# Patient Record
Sex: Male | Born: 1935 | Race: White | Hispanic: No | Marital: Married | Smoking: Former smoker
Health system: Southern US, Community
[De-identification: ages and names within clinical notes are randomized; demographics above are authoritative.]

## PROBLEM LIST (undated history)

## (undated) DIAGNOSIS — N138 Other obstructive and reflux uropathy: Secondary | ICD-10-CM

## (undated) DIAGNOSIS — C61 Malignant neoplasm of prostate: Secondary | ICD-10-CM

## (undated) DIAGNOSIS — C73 Malignant neoplasm of thyroid gland: Secondary | ICD-10-CM

## (undated) DIAGNOSIS — I10 Essential (primary) hypertension: Secondary | ICD-10-CM

## (undated) DIAGNOSIS — E785 Hyperlipidemia, unspecified: Secondary | ICD-10-CM

## (undated) DIAGNOSIS — R413 Other amnesia: Secondary | ICD-10-CM

## (undated) DIAGNOSIS — C4400 Unspecified malignant neoplasm of skin of lip: Secondary | ICD-10-CM

## (undated) DIAGNOSIS — D649 Anemia, unspecified: Secondary | ICD-10-CM

## (undated) DIAGNOSIS — K469 Unspecified abdominal hernia without obstruction or gangrene: Secondary | ICD-10-CM

## (undated) HISTORY — PX: HERNIA REPAIR: SHX51

## (undated) HISTORY — DX: Anemia, unspecified: D64.9

## (undated) HISTORY — DX: Other amnesia: R41.3

## (undated) HISTORY — PX: JOINT REPLACEMENT: SHX530

## (undated) HISTORY — PX: EYE SURGERY: SHX253

## (undated) HISTORY — DX: Unspecified malignant neoplasm of skin of lip: C44.00

## (undated) HISTORY — PX: PROSTATE SURGERY: SHX751

## (undated) HISTORY — PX: OTHER SURGICAL HISTORY: SHX169

## (undated) HISTORY — PX: CATARACT EXTRACTION: SUR2

## (undated) HISTORY — PX: THYROID LOBECTOMY: SHX420

## (undated) HISTORY — DX: Other obstructive and reflux uropathy: N13.8

---

## 1984-08-14 HISTORY — PX: BACK SURGERY: SHX140

## 1984-08-14 HISTORY — PX: SPINE SURGERY: SHX786

## 2002-08-05 ENCOUNTER — Ambulatory Visit (HOSPITAL_COMMUNITY): Admission: RE | Admit: 2002-08-05 | Discharge: 2002-08-05 | Payer: Self-pay | Admitting: Internal Medicine

## 2002-08-06 ENCOUNTER — Encounter (INDEPENDENT_AMBULATORY_CARE_PROVIDER_SITE_OTHER): Payer: Self-pay | Admitting: Specialist

## 2002-08-14 HISTORY — PX: PROSTATECTOMY: SHX69

## 2003-06-02 ENCOUNTER — Ambulatory Visit (HOSPITAL_COMMUNITY): Admission: RE | Admit: 2003-06-02 | Discharge: 2003-06-02 | Payer: Self-pay | Admitting: Urology

## 2003-06-02 ENCOUNTER — Encounter: Payer: Self-pay | Admitting: Urology

## 2003-06-12 ENCOUNTER — Ambulatory Visit: Admission: RE | Admit: 2003-06-12 | Discharge: 2003-08-10 | Payer: Self-pay | Admitting: Radiation Oncology

## 2003-07-28 ENCOUNTER — Inpatient Hospital Stay (HOSPITAL_COMMUNITY): Admission: RE | Admit: 2003-07-28 | Discharge: 2003-07-31 | Payer: Self-pay | Admitting: Urology

## 2003-07-28 ENCOUNTER — Encounter (INDEPENDENT_AMBULATORY_CARE_PROVIDER_SITE_OTHER): Payer: Self-pay | Admitting: Specialist

## 2004-01-15 ENCOUNTER — Ambulatory Visit (HOSPITAL_COMMUNITY): Admission: RE | Admit: 2004-01-15 | Discharge: 2004-01-15 | Payer: Self-pay | Admitting: Urology

## 2004-01-19 ENCOUNTER — Ambulatory Visit: Admission: RE | Admit: 2004-01-19 | Discharge: 2004-04-18 | Payer: Self-pay | Admitting: Radiation Oncology

## 2004-05-10 ENCOUNTER — Ambulatory Visit: Admission: RE | Admit: 2004-05-10 | Discharge: 2004-05-10 | Payer: Self-pay | Admitting: Radiation Oncology

## 2004-05-13 ENCOUNTER — Ambulatory Visit: Admission: RE | Admit: 2004-05-13 | Discharge: 2004-05-13 | Payer: Self-pay | Admitting: Radiation Oncology

## 2004-08-14 DIAGNOSIS — C73 Malignant neoplasm of thyroid gland: Secondary | ICD-10-CM

## 2004-08-14 HISTORY — DX: Malignant neoplasm of thyroid gland: C73

## 2004-11-25 ENCOUNTER — Ambulatory Visit (HOSPITAL_COMMUNITY): Admission: RE | Admit: 2004-11-25 | Discharge: 2004-11-25 | Payer: Self-pay | Admitting: Urology

## 2005-02-10 ENCOUNTER — Encounter (INDEPENDENT_AMBULATORY_CARE_PROVIDER_SITE_OTHER): Payer: Self-pay | Admitting: Specialist

## 2005-02-10 ENCOUNTER — Ambulatory Visit (HOSPITAL_COMMUNITY): Admission: RE | Admit: 2005-02-10 | Discharge: 2005-02-10 | Payer: Self-pay | Admitting: Internal Medicine

## 2005-04-21 ENCOUNTER — Ambulatory Visit (HOSPITAL_COMMUNITY): Admission: RE | Admit: 2005-04-21 | Discharge: 2005-04-22 | Payer: Self-pay | Admitting: Surgery

## 2005-04-21 ENCOUNTER — Encounter (INDEPENDENT_AMBULATORY_CARE_PROVIDER_SITE_OTHER): Payer: Self-pay | Admitting: *Deleted

## 2005-05-30 ENCOUNTER — Encounter (HOSPITAL_COMMUNITY): Admission: RE | Admit: 2005-05-30 | Discharge: 2005-08-08 | Payer: Self-pay | Admitting: Endocrinology

## 2005-06-09 ENCOUNTER — Ambulatory Visit (HOSPITAL_COMMUNITY): Admission: RE | Admit: 2005-06-09 | Discharge: 2005-06-09 | Payer: Self-pay | Admitting: Endocrinology

## 2005-06-15 ENCOUNTER — Ambulatory Visit: Payer: Self-pay | Admitting: Internal Medicine

## 2005-06-26 ENCOUNTER — Encounter (INDEPENDENT_AMBULATORY_CARE_PROVIDER_SITE_OTHER): Payer: Self-pay | Admitting: Specialist

## 2005-06-26 ENCOUNTER — Ambulatory Visit: Payer: Self-pay | Admitting: Internal Medicine

## 2005-06-26 ENCOUNTER — Ambulatory Visit (HOSPITAL_COMMUNITY): Admission: RE | Admit: 2005-06-26 | Discharge: 2005-06-26 | Payer: Self-pay | Admitting: Internal Medicine

## 2005-06-26 HISTORY — PX: OTHER SURGICAL HISTORY: SHX169

## 2005-09-09 ENCOUNTER — Emergency Department (HOSPITAL_COMMUNITY): Admission: EM | Admit: 2005-09-09 | Discharge: 2005-09-09 | Payer: Self-pay | Admitting: Emergency Medicine

## 2005-09-13 ENCOUNTER — Emergency Department (HOSPITAL_COMMUNITY): Admission: EM | Admit: 2005-09-13 | Discharge: 2005-09-13 | Payer: Self-pay | Admitting: Family Medicine

## 2005-09-16 ENCOUNTER — Emergency Department (HOSPITAL_COMMUNITY): Admission: EM | Admit: 2005-09-16 | Discharge: 2005-09-16 | Payer: Self-pay | Admitting: Emergency Medicine

## 2006-06-29 ENCOUNTER — Emergency Department (HOSPITAL_COMMUNITY): Admission: EM | Admit: 2006-06-29 | Discharge: 2006-06-29 | Payer: Self-pay | Admitting: Emergency Medicine

## 2006-07-09 ENCOUNTER — Emergency Department (HOSPITAL_COMMUNITY): Admission: EM | Admit: 2006-07-09 | Discharge: 2006-07-09 | Payer: Self-pay | Admitting: Family Medicine

## 2007-02-05 ENCOUNTER — Encounter: Admission: RE | Admit: 2007-02-05 | Discharge: 2007-02-05 | Payer: Self-pay | Admitting: Surgery

## 2007-02-07 ENCOUNTER — Ambulatory Visit (HOSPITAL_BASED_OUTPATIENT_CLINIC_OR_DEPARTMENT_OTHER): Admission: RE | Admit: 2007-02-07 | Discharge: 2007-02-07 | Payer: Self-pay | Admitting: Surgery

## 2007-02-21 ENCOUNTER — Encounter: Admission: RE | Admit: 2007-02-21 | Discharge: 2007-02-21 | Payer: Self-pay | Admitting: Surgery

## 2008-11-26 ENCOUNTER — Emergency Department (HOSPITAL_COMMUNITY): Admission: EM | Admit: 2008-11-26 | Discharge: 2008-11-26 | Payer: Self-pay | Admitting: Emergency Medicine

## 2009-08-19 ENCOUNTER — Emergency Department (HOSPITAL_COMMUNITY): Admission: EM | Admit: 2009-08-19 | Discharge: 2009-08-19 | Payer: Self-pay | Admitting: Family Medicine

## 2009-09-03 ENCOUNTER — Encounter (INDEPENDENT_AMBULATORY_CARE_PROVIDER_SITE_OTHER): Payer: Self-pay | Admitting: *Deleted

## 2009-09-03 DIAGNOSIS — R002 Palpitations: Secondary | ICD-10-CM | POA: Insufficient documentation

## 2009-09-08 ENCOUNTER — Ambulatory Visit: Payer: Self-pay

## 2009-09-22 DIAGNOSIS — I10 Essential (primary) hypertension: Secondary | ICD-10-CM | POA: Insufficient documentation

## 2009-09-22 DIAGNOSIS — E7800 Pure hypercholesterolemia, unspecified: Secondary | ICD-10-CM | POA: Insufficient documentation

## 2009-09-23 ENCOUNTER — Ambulatory Visit: Payer: Self-pay | Admitting: Internal Medicine

## 2009-10-08 ENCOUNTER — Ambulatory Visit: Payer: Self-pay | Admitting: Internal Medicine

## 2009-10-08 ENCOUNTER — Ambulatory Visit: Payer: Self-pay

## 2009-10-08 ENCOUNTER — Encounter: Payer: Self-pay | Admitting: Internal Medicine

## 2009-10-08 ENCOUNTER — Ambulatory Visit (HOSPITAL_COMMUNITY): Admission: RE | Admit: 2009-10-08 | Discharge: 2009-10-08 | Payer: Self-pay | Admitting: Internal Medicine

## 2009-12-29 ENCOUNTER — Ambulatory Visit: Payer: Self-pay | Admitting: Internal Medicine

## 2010-02-05 ENCOUNTER — Emergency Department (HOSPITAL_COMMUNITY): Admission: EM | Admit: 2010-02-05 | Discharge: 2010-02-05 | Payer: Self-pay | Admitting: Emergency Medicine

## 2010-06-23 DIAGNOSIS — C4400 Unspecified malignant neoplasm of skin of lip: Secondary | ICD-10-CM

## 2010-06-23 HISTORY — PX: SKIN CANCER EXCISION: SHX779

## 2010-06-23 HISTORY — DX: Unspecified malignant neoplasm of skin of lip: C44.00

## 2010-09-04 ENCOUNTER — Encounter: Payer: Self-pay | Admitting: Endocrinology

## 2010-09-12 ENCOUNTER — Telehealth: Payer: Self-pay | Admitting: Internal Medicine

## 2010-09-13 DIAGNOSIS — Z860100 Personal history of colon polyps, unspecified: Secondary | ICD-10-CM | POA: Insufficient documentation

## 2010-09-13 NOTE — Assessment & Plan Note (Signed)
Summary: 3 month rov.sl   Visit Type:  Follow-up Primary Provider:  Burton Apley   History of Present Illness: [name] returns today for evaluation of palpitations.  He is a pleasant 75 yo man with a h/o HTN who has had symptomatic palpitations in the past.  He had a cardiac monitor which demonstrated PAC's.  He has been stable since I saw him last with no c/p or sob.  No syncope.  Current Medications (verified): 1)  Ketoconazole 200 Mg Tabs (Ketoconazole) .... Take One Tablet By Mouth Twice Daily. 2)  Lisinopril 5 Mg Tabs (Lisinopril) .... Take One Tablet By Mouth Daily 3)  Maxzide 75-50 Mg Tabs (Triamterene-Hctz) .... 1/2 Tab Once Daily 4)  Lipitor 20 Mg Tabs (Atorvastatin Calcium) .... Take One Tablet By Mouth Daily. 5)  Synthroid 150 Mcg Tabs (Levothyroxine Sodium) .... Take One Tablet By Mouth Once Daily. 6)  Prednisone 10 Mg Tabs (Prednisone) .... Take One Tablet By Mouth Once Daily. 7)  Calcium Lactate 750 Mg Tabs (Calcium Lactate) .... Take One Tablet By Mouth Once Daily. 8)  Vitamin D 400 Unit Tabs (Cholecalciferol) .... Take One Tablet By Mouth Once Daily. 9)  Vitamin E 600 Unit  Caps (Vitamin E) .... Take One Tablet By Mouth Once Daily. 10)  Fish Oil   Oil (Fish Oil) .... Take One Tablet By Mouth Once Daily. 11)  Vitamin C 500 Mg  Tabs (Ascorbic Acid) .... Take One Tablet By Mouth Once Daily. 12)  Lupron Depot 7.5 Mg Kit (Leuprolide Acetate) .... Uad 13)  Aspirin 81 Mg Tbec (Aspirin) .... Take One Tablet By Mouth Daily  Allergies (verified): No Known Drug Allergies  Past History:  Past Medical History: Last updated: 09/22/2009  hypertension, cancer, high cholesterol  Past Surgical History: Last updated: 09/22/2009  1. Left inguinal hernia repair in 1993.  2. Back surgery in 1986.  3. Knee surgery in 1988.  Review of Systems  The patient denies chest pain, syncope, dyspnea on exertion, and peripheral edema.    Vital Signs:  Patient profile:   75 year  old male Height:      70 inches Weight:      165 pounds BMI:     23.76 Pulse rate:   77 / minute BP sitting:   140 / 80  (left arm)  Vitals Entered By: Laurance Flatten CMA (Dec 29, 2009 10:53 AM)  Physical Exam  General:  Well developed, well nourished, in no acute distress.  HEENT: normal Neck: supple. No JVD. Carotids 2+ bilaterally no bruits Cor: RRR no rubs, gallops or murmur. Rare premature beats are present. Lungs: CTA Ab: soft, nontender. nondistended. No HSM. Good bowel sounds Ext: warm. no cyanosis, clubbing or edema Neuro: alert and oriented. Grossly nonfocal. affect pleasant    Impression & Recommendations:  Problem # 1:  PALPITATIONS (ICD-785.1) His symptoms appear to be well controlled.  Will continue current meds and undergo a period of watchful waiting. His updated medication list for this problem includes:    Lisinopril 5 Mg Tabs (Lisinopril) .Marland Kitchen... Take one tablet by mouth daily    Aspirin 81 Mg Tbec (Aspirin) .Marland Kitchen... Take one tablet by mouth daily  Problem # 2:  HYPERTENSION (ICD-401.9) His blood pressure has been reasonably well controlled.  Continue a low sodium diet and his current meds. His updated medication list for this problem includes:    Lisinopril 5 Mg Tabs (Lisinopril) .Marland Kitchen... Take one tablet by mouth daily    Maxzide 75-50 Mg Tabs (Triamterene-hctz) .Marland KitchenMarland KitchenMarland KitchenMarland Kitchen  1/2 tab once daily    Aspirin 81 Mg Tbec (Aspirin) .Marland Kitchen... Take one tablet by mouth daily  Patient Instructions: 1)  Your physician recommends that you schedule a follow-up appointment in: as needed with Dr. Ladona Ridgel.

## 2010-09-13 NOTE — Assessment & Plan Note (Signed)
Summary: NP6/IRREGULAR HEART BEAT/DM   Visit Type:  Initial Consult Primary Provider:  Burton Apley   History of Present Illness: [name] is referred today by Dr. Eden Emms for evaluation of palpitations.  The patient has an extensive past medical hx of prostate CA, s/p prostatectomy, dyslipidemia and HTN.  Over the past few months he notes occaisional palpitations.  The patient states that his symptoms will rarely occur and last up to 10 minutes.  He has taken his heart rate with his blood pressure machine and noted rates over 160/min.  He denies syncope.  He also has had problems with skipped beats though he is only minimally symptomatic from these.  No other complaints today.  Current Medications (verified): 1)  Ketoconazole 200 Mg Tabs (Ketoconazole) .... Take One Tablet By Mouth Twice Daily. 2)  Lisinopril 5 Mg Tabs (Lisinopril) .... Take One Tablet By Mouth Daily 3)  Maxzide 75-50 Mg Tabs (Triamterene-Hctz) .... 1/2 Tab Once Daily 4)  Lipitor 20 Mg Tabs (Atorvastatin Calcium) .... Take One Tablet By Mouth Daily. 5)  Synthroid 150 Mcg Tabs (Levothyroxine Sodium) .... Take One Tablet By Mouth Once Daily. 6)  Prednisone 10 Mg Tabs (Prednisone) .... Take One Tablet By Mouth Once Daily. 7)  Calcium Lactate 750 Mg Tabs (Calcium Lactate) .... Take One Tablet By Mouth Once Daily. 8)  Vitamin D 400 Unit Tabs (Cholecalciferol) .... Take One Tablet By Mouth Once Daily. 9)  Vitamin E 600 Unit  Caps (Vitamin E) .... Take One Tablet By Mouth Once Daily. 10)  Fish Oil   Oil (Fish Oil) .... Take One Tablet By Mouth Once Daily. 11)  Vitamin C 500 Mg  Tabs (Ascorbic Acid) .... Take One Tablet By Mouth Once Daily. 12)  Lupron Depot 7.5 Mg Kit (Leuprolide Acetate) .... Uad  Allergies (verified): No Known Drug Allergies  Past History:  Past Medical History: Last updated: 09/24/2009  hypertension, cancer, high cholesterol  Past Surgical History: Last updated: 09/24/2009  1. Left inguinal  hernia repair in 1993.  2. Back surgery in 1986.  3. Knee surgery in 1988.  Family History: Last updated: 09-24-2009 Father died of an MI at 53.  Mother died of ENT cancer at  15.  Two brothers.  Wife, [name].  Social History: Last updated: 09-24-2009  non-smoker, occasional drinker  Review of Systems       All systems reviewed and negative except as noted in the HPI.  Vital Signs:  Patient profile:   75 year old male Height:      70 inches Weight:      165 pounds BMI:     23.76 Pulse rate:   80 / minute BP sitting:   130 / 78  (right arm)  Vitals Entered By: Laurance Flatten CMA (September 23, 2009 10:17 AM)  Physical Exam  General:  Well developed, well nourished, in no acute distress.  HEENT: normal Neck: supple. No JVD. Carotids 2+ bilaterally no bruits Cor: RRR no rubs, gallops or murmur. Rare premature beats are present. Lungs: CTA Ab: soft, nontender. nondistended. No HSM. Good bowel sounds Ext: warm. no cyanosis, clubbing or edema Neuro: alert and oriented. Grossly nonfocal. affect pleasant    EKG  Procedure date:  09/23/2009  Findings:      Normal sinus rhythm with rate of: 80.  PAC's noted.    Impression & Recommendations:  Problem # 1:  PALPITATIONS (ICD-785.1) The etiology of his symptoms is unclear. I have recommended a period of watchful waiting.  He could ultimately end up having atrial fibrillation but there is no documentation of this.  I have recommended an echo to evaluate his LV function and chamber sizes.  I have reviewed his cardionet monitor which reveals NSR and ST with PAC's. His updated medication list for this problem includes:    Lisinopril 5 Mg Tabs (Lisinopril) .Marland Kitchen... Take one tablet by mouth daily  Orders: EKG w/ Interpretation (93000) Echocardiogram (Echo)  Problem # 2:  HYPERTENSION (ICD-401.9) His blood pressure remains well controlled.  A low sodium diet is recommended. His updated medication list for this problem  includes:    Lisinopril 5 Mg Tabs (Lisinopril) .Marland Kitchen... Take one tablet by mouth daily    Maxzide 75-50 Mg Tabs (Triamterene-hctz) .Marland Kitchen... 1/2 tab once daily  Orders: Echocardiogram (Echo)  Problem # 3:  HYPERCHOLESTEROLEMIA (ICD-272.0) A low fat diet is recommended. His updated medication list for this problem includes:    Lipitor 20 Mg Tabs (Atorvastatin calcium) .Marland Kitchen... Take one tablet by mouth daily.  Patient Instructions: 1)  Your physician recommends that you schedule a follow-up appointment in: 3 months 2)  Your physician has requested that you have an echocardiogram.  Echocardiography is a painless test that uses sound waves to create images of your heart. It provides your doctor with information about the size and shape of your heart and how well your heart's chambers and valves are working.  This procedure takes approximately one hour. There are no restrictions for this procedure.

## 2010-09-13 NOTE — Miscellaneous (Signed)
Summary: Orders Update  Clinical Lists Changes dr Eden Emms has talked with pts wife and pt needs event moniter and then a follow up appt with dr taylor for irregular and rapid heart rate Deliah Goody, RN  September 03, 2009 10:38 AM\ Problems: Added new problem of PALPITATIONS (ICD-785.1) Orders: Added new Referral order of Event (Event) - Signed

## 2010-09-14 ENCOUNTER — Encounter (INDEPENDENT_AMBULATORY_CARE_PROVIDER_SITE_OTHER): Payer: Self-pay | Admitting: *Deleted

## 2010-09-15 ENCOUNTER — Encounter: Payer: Self-pay | Admitting: Internal Medicine

## 2010-09-21 NOTE — Miscellaneous (Signed)
Summary: [facility] previsit  Clinical Lists Changes  Medications: Added new medication of DULCOLAX 5 MG  TBEC (BISACODYL) Day before procedure take 2 at 3pm and 2 at 8pm. - Signed Added new medication of METOCLOPRAMIDE HCL 10 MG  TABS (METOCLOPRAMIDE HCL) As per prep instructions. - Signed Added new medication of MIRALAX   POWD (POLYETHYLENE GLYCOL 3350) As per prep  instructions. - Signed Rx of DULCOLAX 5 MG  TBEC (BISACODYL) Day before procedure take 2 at 3pm and 2 at 8pm.;  #4 x 0;  Signed;  Entered by: Karl Bales RN;  Authorized by: Hart Carwin MD;  Method used: Electronically to [facility] Dr. # [phone]*, [address] [place], [place]  [address], Ph: [phone], Fax: [phone] Rx of METOCLOPRAMIDE HCL 10 MG  TABS (METOCLOPRAMIDE HCL) As per prep instructions.;  #2 x 0;  Signed;  Entered by: Karl Bales RN;  Authorized by: Hart Carwin MD;  Method used: Electronically to [facility] Dr. # [phone]*, [address], [place], [place]  [address], Ph: [phone], Fax: [phone] Rx of MIRALAX   POWD (POLYETHYLENE GLYCOL 3350) As per prep  instructions.;  #255gm x 0;  Signed;  Entered by: Karl Bales RN;  Authorized by: Hart Carwin MD;  Method used: Electronically to [facility] Dr. # [phone]*, [address] [place], [place]  [address], Ph: [phone], Fax: [phone] Observations: Added new observation of NKA: T (09/15/2010 10:31)    Prescriptions: MIRALAX   POWD (POLYETHYLENE GLYCOL 3350) As per prep  instructions.  #255gm x 0   Entered by:   Karl Bales RN   Authorized by:   Hart Carwin MD   Signed by:   Karl Bales RN on 09/15/2010   Method used:   Electronically to        [facility] [address] # [phone]* (retail)       [address]       [place], [place]  [address]       Ph: [phone]       Fax: [phone]   RxID:   [id number] METOCLOPRAMIDE HCL 10 MG  TABS (METOCLOPRAMIDE HCL) As per prep instructions.  #2 x 0   Entered by:   Karl Bales RN   Authorized by:   Hart Carwin MD   Signed by:   Karl Bales RN on 09/15/2010   Method used:   Electronically to        [facility] [address] # [phone]* (retail)       [address]       [place], [place]  [address]       Ph: [phone]       Fax: [phone]   RxID:   [id number] DULCOLAX 5 MG  TBEC (BISACODYL) Day before procedure take 2 at 3pm and 2 at 8pm.  #4 x 0   Entered by:   Karl Bales RN   Authorized by:   Hart Carwin MD   Signed by:   Karl Bales RN on 09/15/2010   Method used:   Electronically to        [facility] # [phone]* (retail)       [address]       [place], [place]  [address]       Ph: [phone]       Fax: [phone]   RxID:   [id number]

## 2010-09-21 NOTE — Procedures (Signed)
Summary: colonoscopy  Patient Name: [name], [name] MRN: [id number] Procedure Procedures: Colonoscopy CPT: 40981.    with polypectomy. CPT: A3573898.  Personnel: Endoscopist: Mechel Haggard L. Juanda Chance, MD.  Referred By: Zenaida Deed, MD.  Exam Location: Exam performed in Endoscopy Suite.  Patient Consent: Procedure, Alternatives, Risks and Benefits discussed, consent obtained,  Indications  Surveillance of: Adenomatous Polyp(s). Initial polypectomy was performed in 2003. 1-2 Polyps were found at Index Exam. Largest polyp removed was 6 to 9 mm. Prior polyp located in distal colon. Pathology of worst  polyp: tubular adenoma.  Increased Risk Screening: Personal history of prostate cancer.  History  Current Medications: Patient is not currently taking Coumadin.  Pre-Exam Physical: Performed Jun 26, 2005. Entire physical exam was normal.  Exam Exam: Extent of exam reached: Cecum, extent intended: Cecum.  The cecum was identified by appendiceal orifice and IC valve. Colon retroflexion performed. Images taken. ASA Classification: II. Tolerance: good.  Monitoring: Pulse and BP monitoring, Oximetry used. Supplemental O2 given.  Colon Prep Used Miralax for colon prep. Prep results: good.  Fluoroscopy: Fluoroscopy was not used.  Sedation Meds: Demerol Versed 6 mg. given IV. Fentanyl 100 mcg. given IV.  Findings - NORMAL EXAM: Cecum.  POLYP: Sigmoid Colon, Maximum size: 8 mm. pedunculated polyp. Distance from Anus 45 cm. Procedure:  snare without cautery, removed, retrieved, Polyp sent to pathology. ICD9: Colon Polyps: 211.3.  - NORMAL EXAM: Rectum. Comments: no evidence of radiation proctitis.   Assessment Abnormal examination, see findings above.  Diagnoses: 211.3: Colon Polyps.   Comments: s/p cold snare polypectomy Events  Unplanned Interventions: No intervention was required.  Unplanned Events: There were no complications. Plans  Post Exam Instructions: No aspirin or  non-steroidal containing medications: 2 weeks.  Medication Plan: Await pathology.  Patient Education: Patient given standard instructions for: Yearly hemoccult testing recommended. Patient instructed to get routine colonoscopy every 5 years.  Disposition: After procedure patient sent to recovery.   cc: Zenaida Deed, MD     Darnell Level, MD  This report was created from the original endoscopy report, which was reviewed and signed by the above listed endoscopist.

## 2010-09-21 NOTE — Progress Notes (Signed)
Summary: Sch'd COL at [facility].  Phone Note Call from Patient Call back at [facility] Phone [phone]   Caller: Wife [name] Call For: Dr. Juanda Chance Reason for Call: Talk to Nurse Summary of Call: Wants to sch'd COL at hosp. Initial call taken by: Karna Christmas,  September 12, 2010 12:21 PM  Follow-up for Phone Call        Message left for patient to call back.Jesse Fall RN  September 12, 2010 1:44 PM Spoke with patient's wife [name]. She would like for patient to have a colonoscopy at the hospital.Last colonoscopy on 06/26/05  Polyp- path tublar adenoma. Will this be okay? Follow-up by: Jesse Fall RN,  September 12, 2010 2:58 PM  Additional Follow-up for Phone Call Additional follow up Details #1::        OK Additional Follow-up by: Hart Carwin MD,  September 13, 2010 12:40 PM     Appended Document: Sch'd COL at [place]. Scheduled patient at [facility] endo(Jackie) for 09/23/10 at 10:00 AM with 9:00 AM arrival. Booking number [id number]. Scheduled for pre visit on 09/15/10 at 10:00 AM. Patient's wife [name]) aware of appointments.  Appended Document: Sch'd COL at [facility].    Clinical Lists Changes  Problems: Added new problem of PERSONAL HISTORY OF COLONIC POLYPS (ICD-V12.72) Orders: Added new Test order of ZCOL (ZCOL) - Signed

## 2010-09-21 NOTE — Letter (Signed)
Summary: [facility] Instructions  [facility]  [address] [place], [place] [address]   Phone: [phone]  Fax: [phone]       [name]    [birth year]    MRN: [id number]       Procedure Day /Date: Friday 09/23/2010     Arrival Time: 9:00 am     Procedure Time: 10:00 am     Location of Procedure:                     _ x_  [facility] ( Outpatient Registration)      PREPARATION FOR COLONOSCOPY WITH MIRALAX  Starting 5 days prior to your procedure Sunday 2/5 do not eat nuts, seeds, popcorn, corn, beans, peas,  salads, or any raw vegetables.  Do not take any fiber supplements (e.g. Metamucil, Citrucel, and Benefiber). ____________________________________________________________________________________________________   THE DAY BEFORE YOUR PROCEDURE         DATE: Thursday 2/9  1   Drink clear liquids the entire day-NO SOLID FOOD  2   Do not drink anything colored red or purple.  Avoid juices with pulp.  No orange juice.  3   Drink at least 64 oz. (8 glasses) of fluid/clear liquids during the day to prevent dehydration and help the prep work efficiently.  CLEAR LIQUIDS INCLUDE: Water Jello Ice Popsicles Tea (sugar ok, no milk/cream) Powdered fruit flavored drinks Coffee (sugar ok, no milk/cream) Gatorade Juice: apple, white grape, white cranberry  Lemonade Clear bullion, consomm, broth Carbonated beverages (any kind) Strained chicken noodle soup Hard Candy  4   Mix the entire bottle of Miralax with 64 oz. of Gatorade/Powerade in the morning and put in the refrigerator to chill.  5   At 3:00 pm take 2 Dulcolax/Bisacodyl tablets.  6   At 4:30 pm take one Reglan/Metoclopramide tablet.  7  Starting at 5:00 pm drink one 8 oz glass of the Miralax mixture every 15-20 minutes until you have finished drinking the entire 64 oz.  You should finish drinking prep around 7:30 or 8:00 pm.  8   If you are nauseated, you may take the 2nd  Reglan/Metoclopramide tablet at 6:30 pm.        9    At 8:00 pm take 2 more DULCOLAX/Bisacodyl tablets.     THE DAY OF YOUR PROCEDURE      DATE:  Friday 2/10  You may drink clear liquids until 6:00 am (4 HOURS BEFORE PROCEDURE).   MEDICATION INSTRUCTIONS  Unless otherwise instructed, you should take regular prescription medications with a small sip of water as early as possible the morning of your procedure.           OTHER INSTRUCTIONS  You will need a responsible adult at least 73 years of age to accompany you and drive you home.   This person must remain in the waiting room during your procedure.  Wear loose fitting clothing that is easily removed.  Leave jewelry and other valuables at home.  However, you may wish to bring a book to read or an iPod/MP3 player to listen to music as you wait for your procedure to start.  Remove all body piercing jewelry and leave at home.  Total time from sign-in until discharge is approximately 2-3 hours.  You should go home directly after your procedure and rest.  You can resume normal activities the day after your procedure.  The day of your procedure you should not:  Drive   Make legal decisions   Operate machinery   Drink alcohol   Return to work  You will receive specific instructions about eating, activities and medications before you leave.   The above instructions have been reviewed and explained to me by   Karl Bales RN  September 15, 2010 10:33 AM    I fully understand and can verbalize these instructions _____________________________ Date _______

## 2010-09-23 ENCOUNTER — Encounter: Payer: Self-pay | Admitting: Internal Medicine

## 2010-09-23 ENCOUNTER — Other Ambulatory Visit: Payer: Medicare Other | Admitting: Internal Medicine

## 2010-09-23 ENCOUNTER — Ambulatory Visit (HOSPITAL_COMMUNITY)
Admission: RE | Admit: 2010-09-23 | Discharge: 2010-09-23 | Disposition: A | Discharge status: Home / Self Care | Payer: Medicare Other | Source: Ambulatory Visit | Attending: Internal Medicine | Admitting: Internal Medicine

## 2010-09-23 DIAGNOSIS — E039 Hypothyroidism, unspecified: Secondary | ICD-10-CM | POA: Insufficient documentation

## 2010-09-23 DIAGNOSIS — Z859 Personal history of malignant neoplasm, unspecified: Secondary | ICD-10-CM | POA: Insufficient documentation

## 2010-09-23 DIAGNOSIS — Z860100 Personal history of colon polyps, unspecified: Secondary | ICD-10-CM | POA: Insufficient documentation

## 2010-09-23 DIAGNOSIS — K5730 Diverticulosis of large intestine without perforation or abscess without bleeding: Secondary | ICD-10-CM | POA: Insufficient documentation

## 2010-09-23 DIAGNOSIS — Z09 Encounter for follow-up examination after completed treatment for conditions other than malignant neoplasm: Secondary | ICD-10-CM | POA: Insufficient documentation

## 2010-09-23 DIAGNOSIS — I10 Essential (primary) hypertension: Secondary | ICD-10-CM | POA: Insufficient documentation

## 2010-09-29 NOTE — Procedures (Addendum)
Summary: Colonoscopy  Patient: [name] Note: All result statuses are Final unless otherwise noted.  Tests: (1) Colonoscopy (COL)   COL Colonoscopy           DONE     [facility]     [address] [place], [place]  [address]           COLONOSCOPY PROCEDURE REPORT           PATIENT:  [name], Johnny  MR#:  [id number]     BIRTHDATE:  Jul 21, 1936, 74 yrs. old  GENDER:  male     ENDOSCOPIST:  Hedwig Morton. Juanda Chance, MD     REF. BY:  Burton Apley, M.D.     PROCEDURE DATE:  09/23/2010     PROCEDURE:  Colonoscopy 64[address]     ASA CLASS:  Class II     INDICATIONS:  screening adenomatous polyps in 2003 and 2006     MEDICATIONS:   Versed 10 mg, Fentanyl 100 mcg           DESCRIPTION OF PROCEDURE:   After the risks benefits and     alternatives of the procedure were thoroughly explained, informed     consent was obtained.  Digital rectal exam was performed and     revealed no rectal masses.   The Pentax Ped Colon [id number]     endoscope was introduced through the anus and advanced to the     cecum, which was identified by both the appendix and ileocecal     valve, without limitations.  The quality of the prep was good,     using MiraLax.  The instrument was then slowly withdrawn as the     colon was fully examined.     <<PROCEDUREIMAGES>>           FINDINGS:  Mild diverticulosis was found (see image1).  Otherwise     normal colonoscopy without other polyps, masses, vascular     ectasias, or inflammatory changes (see image2, image3, image4,     image5, image6, image7, and image8).   Retroflexed views in the     rectum revealed no abnormalities.    The scope was then withdrawn     from the patient and the procedure completed.           COMPLICATIONS:  None     ENDOSCOPIC IMPRESSION:     1) Mild diverticulosis     2) Otherwise nl colonoscopy WMO     RECOMMENDATIONS:     1) high fiber diet     REPEAT EXAM:  In 10 year(s) for.           ______________________________  Hedwig Morton. Juanda Chance, MD           CC:           n.     eSIGNED:   Hedwig Morton. Hadia Minier at 09/23/2010 10:23 AM           Remi Haggard, [phone]  Note: An exclamation mark (!) indicates a result that was not dispersed into the flowsheet. Document Creation Date: 09/23/2010 10:24 AM _______________________________________________________________________  (1) Order result status: Final Collection or observation date-time: 09/23/2010 10:17 Requested date-time:  Receipt date-time:  Reported date-time:  Referring Physician:   Ordering Physician: Lina Sar [phone]) Specimen Source:  Source: Launa Grill Order Number: [id number] Lab site:

## 2010-11-19 ENCOUNTER — Other Ambulatory Visit: Payer: Self-pay | Admitting: Internal Medicine

## 2010-12-22 ENCOUNTER — Other Ambulatory Visit: Payer: Self-pay | Admitting: Dermatology

## 2010-12-27 NOTE — Op Note (Signed)
[name], [name]            ACCOUNT NO.:  [id number]   MEDICAL RECORD NO.:  [id number]          PATIENT TYPE:  AMB   LOCATION:  DSC                          FACILITY:  [facility]   PHYSICIAN:  Velora Heckler, MD      DATE OF BIRTH:  1936-04-29   DATE OF PROCEDURE:  02/07/2007  DATE OF DISCHARGE:                               OPERATIVE REPORT   PREOPERATIVE DIAGNOSIS:  Umbilical hernia.   POSTOPERATIVE DIAGNOSIS:  Umbilical hernia.   PROCEDURE:  Repair umbilical hernia with small Bard Ventralex mesh.   SURGEON:  Velora Heckler, M.D., FACS   ANESTHESIA:  General per Dr. Claybon Jabs.   ESTIMATED BLOOD LOSS:  Minimal.   PREPARATION:  Betadine.   COMPLICATIONS:  None.   INDICATIONS:  The patient is a 76 year old white male well known to my  practice.  The patient noted a bulge at the umbilicus.  He presented for  evaluation in April 2008.  He was noted to have a small reducible  umbilical hernia.  He now comes to surgery for repair.   BODY OF REPORT:  The procedure was done in OR 3 at the [facility].  The patient is brought to the operating room, placed in  supine position on the operating room table.  Following administration  of general anesthesia, the patient is prepped and draped in the usual  strict aseptic fashion.  After ascertaining that an adequate level of  anesthesia had been obtained, an infraumbilical incision was made  transversely with a #15 blade.  Dissection was carried down to the  fascia.  The fascial plane is developed.  The hernia sac is opened.  Incarcerated omentum was reduced back within the peritoneal cavity.  The  fascial ring was completely defined and a space was created in the  preperitoneal space.  A small Bard Ventralex mesh was inserted into the  preperitoneal space and deployed.  The mesh was secured  circumferentially to the fascial ring with interrupted #1 Ethibond  sutures.  The tails of the mesh were excised.  Local field  block is  placed with Marcaine.  The umbilicus was affixed back to the abdominal  wall with interrupted 3-0 Vicryl sutures.  The subcutaneous tissues were  reapproximated with interrupted 3-0 Vicryl sutures.  The skin was  anesthetized with local anesthetic.  The skin was closed with a running  4-0 Vicryl  subcuticular suture.  The wound is washed and dried and Benzoin and  Steri-Strips are applied.  Sterile dressings were applied.  The patient  is awakened from anesthesia and brought to the recovery room in stable  condition.  The patient tolerated the procedure well.      Velora Heckler, MD  Electronically Signed     TMG/MEDQ  D:  02/07/2007  T:  02/07/2007  Job:  [id number]   cc:   Jeannett Senior A. Evlyn Kanner, M.D.  Antony Madura, M.D.  [employer], M.D.

## 2010-12-27 NOTE — Op Note (Signed)
NAMEADELFO, DIEBEL            ACCOUNT NO.:  [id number]   MEDICAL RECORD NO.:  [id number]          PATIENT TYPE:  AMB   LOCATION:  DSC                          FACILITY:  [facility]   PHYSICIAN:  Velora Heckler, MD      DATE OF BIRTH:  12/31/1935   DATE OF PROCEDURE:  DATE OF DISCHARGE:                               OPERATIVE REPORT   No dictation for this job.      Velora Heckler, MD  Electronically Signed     TMG/MEDQ  D:  02/07/2007  T:  02/07/2007  Job:  [id number]

## 2010-12-30 NOTE — Discharge Summary (Signed)
NAME:  [name], [name]                      ACCOUNT NO.:  [id number]   MEDICAL RECORD NO.:  [id number]                   PATIENT TYPE:  INP   LOCATION:  0340                                 FACILITY:  [facility]   PHYSICIAN:  [place] Service, M.D.             DATE OF BIRTH:  [birth year]   DATE OF ADMISSION:  07/28/2003  DATE OF DISCHARGE:  07/31/2003                                 DISCHARGE SUMMARY   PRIMARY CARE PHYSICIAN:  Antony Madura, M.D.   CARDIOLOGY:  Garnette Scheuermann, M.D.   Indications, medications, allergies, tobacco, ETOH, past medical history,  social history, physical examination, and review of systems were all  outlined in the admitting note.   HOSPITAL COURSE:  The patient was admitted on July 28, 2003, underwent  retropubic prostatectomy and pelvic lymph node dissection.  The patient had  a pleasantly uneventful postoperative recovery.  On admission, hemoglobin  was 11 after autologous donation of 2 units of packed cells.  The patient  was given those two units intraoperatively, and was given an additional two  units of blood in the recovery room.  Hemoglobin on postoperative day #1,  was 9.4.  The patient had active bowel sounds.  Jackson-Pratt output which  had been about 90 cc per shift fell to about 20 cc per shift by  postoperative day #1.  On postoperative day #2, urine output had increased  to about 1400 cc per shift.  Jackson-Pratt output was about 10 to 20 cc per  shift.  Hemoglobin stable at 8.8.  On postoperative day #3, Jackson-Pratt  drain was removed.  The patient was taking a regular diet, had a bowel  movement, incision was clean and dry.  PCA was discontinued, IV was  discontinued.  The patient was discharged home.  Pathology report pending at  the time of discharge.   DISCHARGE MEDICATIONS:  1. Cipro.  2. Vicodin.   FOLLOWUP:  Plan for followup office visit in about four to five days for  skin staples.  We will discuss pathology report at  that time.  The patient  is instructed to call us if there are any questions or problems.                                               [place] Service, M.D.    RH/MEDQ  D:  07/31/2003  T:  07/31/2003  Job:  [id number]   cc:   Dr. Alphonsa Gin, M.D.  [address], [address]  [place]  [place] [address]  Fax: [phone]

## 2010-12-30 NOTE — Op Note (Signed)
NAME:  DAYMEN, HASSEBROCK                      ACCOUNT NO.:  [id number]   MEDICAL RECORD NO.:  [id number]                   PATIENT TYPE:  INP   LOCATION:  0006                                 [facility]ACILITY[facility]   PHYSICIAN:  [place] Service, M.D.             DATE OF BIRTH:  Sep 16, 1935   DATE OF PROCEDURE:  07/28/2003  DATE OF DISCHARGE:                                 OPERATIVE REPORT   PREOPERATIVE DIAGNOSIS:  Prostate cancer.   POSTOPERATIVE DIAGNOSIS:  Prostate cancer.   PROCEDURE:  Radical retropubic prostatectomy with pelvic lymph node  dissection.   ANESTHESIA:  General.   DRAINS:  20 French Foley, 10 French Jackson-Pratt.   ESTIMATED BLOOD LOSS:  Hematocrit 35% at the start of the case, 28% at the  close.  Replaced with two units of autologous blood.   COMPLICATIONS:  None.   SURGEONS:  [place] Service, M.D., Lucrezia Starch. Earlene Plater, M.D., Susanne Borders, M.D.   SPECIMENS:  Prostate with attached vas and seminal vesicles, right and left  pelvic nodes.   DESCRIPTION OF PROCEDURE:  The patient was prepped and draped in the supine  position after institution of adequate level of general anesthesia, TED hose  and PAS were in place.  A 24 French Foley catheter was inserted and left to  straight drain.  A midline infraumbilical incision was carried through the  skin and muscular layers of the anterior abdominal wall to reveal the  retropubic space.  Bookwalter retractor was positioned.  Some adhesions were  taken down sharply posterior to his prior left inguinal hernia repair.  Node  dissection was then carried out in the right and left obturator fossa  between the external iliac vein superiorly and the obturator nerve  inferiorly.  There was no evidence of injury to the vein or nerve.  A large  aggregate of fibrolymphatic tissue was retrieved in both cases.  No enlarged  or indurated nodes could be identified.  Nodes were carefully examined on  the left, as the patient's tumor  was on this side.  Endopelvic fascia was  then taken down sharply lateral to the prostate and superior to the  neurovascular bundles.  Puboprostatic ligaments were taken down sharply.  A  McDougal clamp was then passed anterior to the urethra distal to the  prostatic apex.  The dorsal vein complex was oversewn with three separate  ties of 2-0 Vicryl and then divided.  Anterior surface of the urethra was  visualized, dissected free from the neurovascular bundle on either side.  Knife on a long handle was used to incise the anterior  urethra.  Indwelling  Foley catheter was grasped and retracted superiorly.  Right and left  prostatic pedicles were then taken down segmentally with free ties of silk  and Hemoclips on right angle clip appliers.  Dissection was carried until  the vas and seminal vesicles were in view.  Vas was encircled with a right  angle  clamp and then clipped proximally on both the right and left sides.  Seminal vesicle was then dissected free at about three-quarters of its  length, encircled with a blunt right angle clamp, divided, and then oversewn  with a single silk tie.  Focus was then brought to the bladder neck.  Sissy Hoff hemostat was used to separate muscular fibers at the bladder neck.  Dissection was somewhat difficult due to a large median lobe, which somewhat  distorted the bladder neck.  Dissection was carried fully around the median  lobe.  The bladder was then retracted superiorly.  Anterior surface of the  right and left seminal vesicles were taken down sharply.  Prostate was then  delivered free as a specimen with attached vas and seminal vesicles.  Bladder neck was the aperture of the small finger.  No need for tailoring  was felt.  Mucosa at the bladder neck was then everted using interrupted  sutures of 3-0 chromic.  Greenwald sound was then passed per urethra.  Stitches were placed in the urethral stump at the 12 o'clock, 10 o'clock, 8  o'clock, 5  o'clock, and 2 o'clock position.  No attempt was made to place a  stitch at the 6 o'clock position.  Stitches were then brought through the  bladder neck in a similar position.  A 20 [place] pure silicone catheter was  passed into the bladder, 20 mL in a 5 mL balloon.  Anastomosis was then tied  down and demonstrated with irrigation to be watertight.  A 10 flat Al Pimple drain was brought out through a stab incision in the left lower  quadrant.  Fascia was closed with #1 PDS.  The patient was given 30 mg of  Toradol at the time.  Skin was closed with skin staples.  Foley was left to  straight drain.  The JP was left to bulb suction and the patient was  returned to recovery in satisfactory condition.  Hematocrit 35% at the start  of the case.  The patient had donated two units of autologous blood, 28% at  the close of the case, decision made to give autologous blood  intraoperatively.                                               [place] Service, M.D.    RH/MEDQ  D:  07/28/2003  T:  07/28/2003  Job:  [id number]   cc:   Antony Madura, M.D.  [address]  [place]  [place] [address]  Fax: [phone]   Lyn Records III, M.D.  [address]  [place]  [place] [address]  Fax: [phone]

## 2010-12-30 NOTE — Op Note (Signed)
NAME:  [name], [name]                        ACCOUNT NO.:  [id number]   MEDICAL RECORD NO.:  [id number]                   PATIENT TYPE:  AMB   LOCATION:  ENDO                                 FACILITY:  [facility]   PHYSICIAN:  Lina Sar, M.D. [facility]               DATE OF BIRTH:  [birth year]   DATE OF PROCEDURE:  08/05/2002  DATE OF DISCHARGE:                                 OPERATIVE REPORT   PROCEDURE:  Colonoscopy.   ENDOSCOPIST:  Lina Sar, M.D.   INDICATIONS:  This 75 year old gentleman is undergoing screening  colonoscopy.  He has a history of gastroesophageal reflux causing occasional  heartburn and he has had rare constipation with flareup of hemorrhoids.  There is an indirect family history of colon cancer in an uncle.  On  physical exam on July 07, 2002, the patient's abdominal exam was  negative.  He is now undergoing a colonoscopy for neoplastic screening.   ENDOSCOPE:  Fujinon single-channel videoscope.   SEDATION:  Versed 10 mg IV, fentanyl 100 mcg IV.   FINDINGS:  Fujinon single-channel videoscope was passed under direct vision  in through the rectum to the sigmoid colon.  The patient was monitored by a  pulse oximeter; his oxygen saturations were normal.  His prep was excellent.  Anal canal and rectal ampulla showed small unremarkable hemorrhoids.  There  was a diminutive polyp at 20 cm from the rectum which was ablated with hot  biopsies.  There were scattered diverticula through the sigmoid colon but  luminal size was normal.  At the level of 60 cm from the rectum was a  pedunculated 1-cm-in-diameter, smooth-appearing polyp which was easily  removed with snare and sent to pathology.  The hepatic flexure and ascending  colon appeared normal.  In the distal cecum at the ileocecal valve was  another 1-cm pedunculated polyp which was easily grasped with snare and  removed and sent to pathology in a separate specimen container.  Cecal pouch  itself and ileocecal  valve were normal.  Colonoscope was then retracted and  the colon decompressed.  There was no bleeding from post-polypectomy site.  Patient tolerated the procedure well.   IMPRESSION:  1. Multiple colonic polyps, status post multiple polypectomies.  2. Mild diverticulosis of the left colon.    PLAN:  1. Post-polypectomy instructions will include holding off aspirin and NSAIDs     for two weeks.  2. Recall colonoscopy in three years.                                               Lina Sar, M.D. [facility]    DB/MEDQ  D:  08/05/2002  T:  08/06/2002  Job:  [id number]   cc:   Antony Madura, M.D.  [address]  [place]  [place] [address]  Fax: [phone]

## 2010-12-30 NOTE — H&P (Signed)
NAME:  [name], [name]                      ACCOUNT NO.:  [id number]   MEDICAL RECORD NO.:  [id number]                   PATIENT TYPE:  INP   LOCATION:  0006                                 FACILITY:  [facility]   PHYSICIAN:  Boston Service, M.D.             DATE OF BIRTH:  06-19-1936   DATE OF ADMISSION:  07/28/2003  DATE OF DISCHARGE:                                HISTORY & PHYSICAL   LOCAL MEDICAL DOCTOR:  Antony Madura, M.D.   CARDIOLOGIST:  Lyn Records, M.D.   INDICATIONS:  A 75 year old male with prostate cancer.   This are the notable points of the recent history:  1. In 1999 PSA about 5.  Careful follow-up with Vic Blackbird, M.D.     TRUS with BX - BPH.  In 2003, PSA 5.6.  2. October 2004 - PSA rose to 14 for unexplained reasons.  TRUS with biopsy     showed a Gleason 7 adenocarcinoma occupying about 15% of the left     biopsies.  CT scan chest, abdomen, and pelvis June 08, 2003 negative     for metastatic disease.  Bone scan June 02, 2003 negative for     metastatic disease.  The patient then underwent clinical opinion - first     with Dr. Aldean Ast, then with Dr. Logan Bores, then with Dr. Wanda Plump.  3. Reviewed with the patient and family members the risks and benefits of     prostatectomy given his young age, good health, and localized nature of     the cancer, as well as the possibility of impotence and incontinence,     bleeding and infection, injury to the rectum, nerve, blood vessels, and     bladder neck.  4. The patient elected autologous donation of 2 units of packed cells.  His     wife, [name], is a nurse at [employer].  I have reviewed with her     the risks and benefits of surgery.  The patient's hemoglobin after     donation of 2 units is 11.   PLAN:  Proceed with prostatectomy this a.m.   MEDICATIONS:  Atenolol, Lipitor, Maxzide, and Tranxene.   ALLERGIES:  None.  GI upset with ASPIRIN.   PAST MEDICAL HISTORY:  1. Left inguinal  hernia repair in 1993.  2. Back surgery in 1986.  3. Knee surgery in 1988.  4. The patient had stress test in 2001 with Hank Smith normal by report.   SOCIAL HISTORY:  Father died of an MI at 11.  Mother died of ENT cancer at  67.  Two brothers.  Wife, [name].   PHYSICAL EXAMINATION:  GENERAL:  Tall, thin, healthy, alert 75 year old  male.  VITAL SIGNS:  Weight today 179 pounds.  Temperature 97, pulse 52,  respirations 18, blood pressure 170/89.  HEENT:  Negative adenopathy, negative bruit.  LUNGS:  Clear to P&A.  HEART:  Regular rate and  rhythm without murmur or gallop.  ABDOMEN:  Positive bowel sounds, soft, nontender.  GENITOURINARY:  Bilaterally descended testes, no evidence of recurrent  hernia.  Non-nodular prostate.  NEUROLOGIC:  Grossly intact.   PLAN:  Will proceed this a.m. with prostatectomy.                                               [place] Service, M.D.    RH/MEDQ  D:  07/28/2003  T:  07/28/2003  Job:  [id number]   cc:   Antony Madura, M.D.  [address]  [place]  [place] [address]  Fax: [phone]   Lyn Records III, M.D.  [address]  [place]  [place] [address]  Fax: [phone]

## 2010-12-30 NOTE — Op Note (Signed)
[name], [name]            ACCOUNT NO.:  [id number]   MEDICAL RECORD NO.:  [id number]          PATIENT TYPE:  OIB   LOCATION:  2866                         FACILITY:  [facility]   PHYSICIAN:  Johnny Heckler, MD      DATE OF BIRTH:  03/31/1936   DATE OF PROCEDURE:  04/21/2005  DATE OF DISCHARGE:                                 OPERATIVE REPORT   PREOPERATIVE DIAGNOSIS:  Left thyroid nodule.   POSTOPERATIVE DIAGNOSIS:  Follicular variant papillary thyroid carcinoma.   PROCEDURE:  Total thyroidectomy.   SURGEON:  [name], M.D.   ASSISTANT:  [name], M.D.   ANESTHESIA:  General.   ESTIMATED BLOOD LOSS:  Minimal.   PREPARATION:  Betadine.   COMPLICATIONS:  None.   INDICATIONS:  The patient is a 75 year old white male referred by [name], M.D. for thyroid nodule. The patient found this on self examination  in May of 2006. The patient was seen by Dr. Su [name] and underwent thyroid  ultrasound. This showed a solid nodule in the left thyroid lobe. Ultrasound-  guided needle biopsy was obtained. This showed indeterminate findings but  were worrisome for a follicular variant of papillary thyroid carcinoma. The  patient now comes to the operating room for resection.   BODY OF REPORT.:  The procedure was done in OR #16 at the [facility].  The patient was brought to the operating room, placed in  a supine position on the operating room table. Following administration of  general anesthesia, the patient was positioned and then prepped and draped  in the usual strict aseptic fashion. After ascertaining that an adequate  level of anesthesia had been obtained, a [name] incision was made with a #10  blade. Dissection was carried down through subcutaneous tissues and  platysma. Skin flaps were elevated cephalad and caudad with the  electrocautery. [name] self-retaining retractor was placed for exposure.  Strap muscles were incised in the  midline. Dissection was begun on the left  neck. Strap muscles were reflected laterally. There was an anteriorly-  located nodule. This was gently dissected out. Venous tributaries were  divided between small and medium ligaclips. Inferior venous tributaries were  divided between small and medium ligaclips. There was an inferior  parathyroid gland which was identified and preserved. Superior pole vessels  were dissected out, ligated in continuity with 2-0 silk ties and medium  ligaclips and divided. Branches of the inferior thyroid artery were divided  between small ligaclips. The gland was rolled anteriorly. Recurrent  laryngeal nerve was identified and preserved. Ligament of [name] was  transected with the electrocautery. Gland was rolled up and onto the  anterior trachea. It was mobilized across the midline. Care was taken to  obtain a soft tissue margin around the nodule. Gland was mobilized across  the isthmus to the right side of the trachea where it was transected between  hemostats and suture ligated with 3-0 Vicryl suture ligatures. Suture was  used to mark the nodule in the anterior left lobe. This was submitted to  pathology. Dr. Berneta [name] did  a frozen section analysis. She feels this  likely represents a follicular variant of papillary thyroid carcinoma.   Therefore, we proceeded with right thyroid lobectomy. Right lobe was grossly  normal. However, strap muscles were reflected laterally. Venous tributaries  were again divided between small and medium ligaclips. Inferior venous  tributaries were divided between medium ligaclips. Superior pole was  dissected out. Vessels were ligated in continuity with 2-0 silk ties and  medium ligaclips and divided. Gland was rolled anteriorly. Both the superior  and inferior parathyroid glands were identified and preserved on the right  side. Recurrent nerve was identified and preserved. Branches of the inferior  thyroid artery were divided  between small ligaclips. Gland was rolled  anteriorly. Ligament of [name] was transected with the electrocautery. Gland  was excised off the trachea and submitted to pathology for review. Both  sides of the neck were irrigated with warm saline. Surgicel was placed over  the area of the recurrent laryngeal nerves and  parathyroid glands  bilaterally. Strap muscles were reapproximated in the midline with  interrupted 3-0 Vicryl sutures. Platysma was closed with interrupted 3-0  Vicryl sutures. Skin was closed with a running 4-0 Vicryl subcuticular  suture. Widely-spaced stainless steel staples were placed. Half inch Steri-  Strips and Benzoin were placed between the staples. Sterile dressings were  applied. The patient was awakened from anesthesia and brought to the  recovery room in stable condition. The patient tolerated the procedure well.      Johnny Heckler, MD  Electronically Signed     TMG/MEDQ  D:  04/21/2005  T:  04/21/2005  Job:  [id number]   cc:   [name], M.D.  [address]  [place]  [place] [address]  Fax: [phone]   [place] Service, M.D.  [address]  [place]  [place] [address]  Fax: [phone]

## 2010-12-30 NOTE — Consult Note (Signed)
[name], [name]            ACCOUNT NO.:  [id number]   MEDICAL RECORD NO.:  [id number]          PATIENT TYPE:  EMS   LOCATION:  MAJO                         FACILITY:  [facility]   PHYSICIAN:  Karol T. Lazarus Salines, M.D. DATE OF BIRTH:  1935-11-22   DATE OF CONSULTATION:  DATE OF DISCHARGE:  06/29/2006                                 CONSULTATION   CHIEF COMPLAINT:  Facial trauma.   HISTORY:  A 75 year old white male stumbled trying to restrain a dog in  his garage and fell forward, striking both knees and landing on a  formica table edge across his right eyebrow.  He sustained a large  laceration.  There was moderate bleeding, which stopped spontaneously.  Very little pain.  No loss of consciousness.  The swelling in the area  has precluded him from opening his eyes terribly well, but he feels like  his vision is okay.  Nothing else was traumatized.  Hearing is intact.  Teeth fit normally with no trismus.   PAST MEDICAL HISTORY:  NO KNOWN ALLERGIES.   SOCIAL HISTORY:  He is married to [name], one of the heads of  the OR nursing unit.   EXAMINATION:  This is a trim, healthy, older, middle-aged white male.  Mental status is appropriate.  He has a roughly 4-cm linear full  thickness laceration going through the lateral right eyebrow and up into  the forehead more medially.  There was another 0.5 cm laceration just  lateral to the lateral canthus on the right side.  Crossing the forehead  wound are several vessels and at least 1 branch of the supraorbital  nerve.  Hemostasis is observed.  There are bits of debris in the wound.   PROCEDURE:  With the patient in a semi-sitting position, anesthesia in  both wounds was obtained using approximately 10 mL of 1% Xylocaine with  1:100,000 epinephrine infiltrated with a 27 gauge needle.  After  allowing time for this to take effect, a mixture of 50/50 Betadine  solution and saline was used to swab out the wound and prep the area.  The wound itself was irrigated with 60 mL of the same mixture using a 16  gauge Angiocath.  Again, hemostasis was observed.   The lateral canthus wound was closed with interrupted 5-0 Ethilon  sutures.   The brow incision was closed laterally through the periosteum with  interrupted 4-0 Vicryl sutures.  The orbicularis oculi muscle was  reapproximated using the same agent.  Finally, the skin was  reapproximated with a running simple 5-0 Ethilon.  Hemostasis was  observed at the time of closure, although he did have some dripping from  the wound and slight swelling as he mobilized to change his clothes  later.  After cleaning the wound, a small amount of bacitracin ointment  was applied.   IMPRESSION:  Right upper brow full thickness and right lateral canthus  region lacerations.   PLAN:  Closed as above.  I gave him a prescription for a small amount of  Tylenol No. 3 for pain relief, which he will probably choose not to use.  I recommend elevation for 3 or 4 days, ice pack x24 hours.  We will  remove the sutures in 10 days.  The patient and wife understand and  agree with the discussion and plans.      Gloris Manchester. Lazarus Salines, M.D.  Electronically Signed     KTW/MEDQ  D:  06/29/2006  T:  06/30/2006  Job:  [id number]   cc:   Nelva Nay, MD

## 2011-04-06 ENCOUNTER — Emergency Department (HOSPITAL_COMMUNITY)
Admission: EM | Admit: 2011-04-06 | Discharge: 2011-04-06 | Disposition: A | Discharge status: Home / Self Care | Payer: Medicare Other | Attending: Emergency Medicine | Admitting: Emergency Medicine

## 2011-04-06 ENCOUNTER — Encounter: Payer: Self-pay | Admitting: *Deleted

## 2011-04-06 ENCOUNTER — Emergency Department (HOSPITAL_BASED_OUTPATIENT_CLINIC_OR_DEPARTMENT_OTHER)
Admission: EM | Admit: 2011-04-06 | Discharge: 2011-04-06 | Disposition: A | Discharge status: Home / Self Care | Payer: Medicare Other | Attending: Emergency Medicine | Admitting: Emergency Medicine

## 2011-04-06 DIAGNOSIS — N35919 Unspecified urethral stricture, male, unspecified site: Secondary | ICD-10-CM | POA: Insufficient documentation

## 2011-04-06 DIAGNOSIS — R339 Retention of urine, unspecified: Secondary | ICD-10-CM | POA: Insufficient documentation

## 2011-04-06 DIAGNOSIS — I10 Essential (primary) hypertension: Secondary | ICD-10-CM | POA: Insufficient documentation

## 2011-04-06 DIAGNOSIS — E039 Hypothyroidism, unspecified: Secondary | ICD-10-CM | POA: Insufficient documentation

## 2011-04-06 DIAGNOSIS — Z8546 Personal history of malignant neoplasm of prostate: Secondary | ICD-10-CM | POA: Insufficient documentation

## 2011-04-06 DIAGNOSIS — E7800 Pure hypercholesterolemia, unspecified: Secondary | ICD-10-CM | POA: Insufficient documentation

## 2011-04-06 HISTORY — DX: Essential (primary) hypertension: I10

## 2011-04-06 HISTORY — DX: Unspecified abdominal hernia without obstruction or gangrene: K46.9

## 2011-04-06 HISTORY — DX: Hyperlipidemia, unspecified: E78.5

## 2011-04-06 HISTORY — DX: Malignant neoplasm of prostate: C61

## 2011-04-06 HISTORY — DX: Malignant neoplasm of thyroid gland: C73

## 2011-04-06 LAB — URINALYSIS, ROUTINE W REFLEX MICROSCOPIC
Glucose, UA: NEGATIVE mg/dL
Ketones, ur: NEGATIVE mg/dL
Leukocytes, UA: NEGATIVE
pH: 6 (ref 5.0–8.0)

## 2011-04-06 LAB — CBC
Platelets: 249 10*3/uL (ref 150–400)
RBC: 3.01 MIL/uL — ABNORMAL LOW (ref 4.22–5.81)
RDW: 11.2 % — ABNORMAL LOW (ref 11.5–15.5)
WBC: 5.4 10*3/uL (ref 4.0–10.5)

## 2011-04-06 LAB — BASIC METABOLIC PANEL
CO2: 26 mEq/L (ref 19–32)
Chloride: 101 mEq/L (ref 96–112)
Creatinine, Ser: 1.4 mg/dL — ABNORMAL HIGH (ref 0.50–1.35)
Glucose, Bld: 114 mg/dL — ABNORMAL HIGH (ref 70–99)

## 2011-04-06 LAB — DIFFERENTIAL
Basophils Absolute: 0.1 10*3/uL (ref 0.0–0.1)
Lymphocytes Relative: 30 % (ref 12–46)
Lymphs Abs: 1.6 10*3/uL (ref 0.7–4.0)
Monocytes Absolute: 0.6 10*3/uL (ref 0.1–1.0)
Neutro Abs: 3.1 10*3/uL (ref 1.7–7.7)

## 2011-04-06 LAB — URINE MICROSCOPIC-ADD ON

## 2011-04-06 MED ORDER — ONDANSETRON 8 MG PO TBDP
8.0000 mg | ORAL_TABLET | Freq: Once | ORAL | Status: AC
  Administered 2011-04-06: 8 mg via ORAL

## 2011-04-06 MED ORDER — MORPHINE SULFATE 4 MG/ML IJ SOLN
4.0000 mg | Freq: Once | INTRAMUSCULAR | Status: AC
  Administered 2011-04-06: 4 mg via INTRAMUSCULAR

## 2011-04-06 NOTE — ED Notes (Signed)
Per Dr Dierdre Highman VO, attempted to pass a coude catheter with no success.  Pt stated that he was experiencing a great deal of pain tonight and that he wanted me to stop.  Dr Dierdre Highman notified, at bedside now.

## 2011-04-06 NOTE — ED Provider Notes (Signed)
History     CSN: [id number] Arrival date & time: 04/06/2011 12:23 AM  Chief Complaint  Patient presents with  . Urinary Retention   Patient is a 75 y.o. male presenting with male genitourinary complaint. The history is provided by the patient.  Male GU Problem Primary symptoms include penile pain.  Primary symptoms include no dysuria. This is a new problem. The current episode started 1 to 2 hours ago. The problem occurs constantly. The problem has been gradually worsening. Context: went to bed and got up at 11pm unable to urinate has h/o prostate cancer and resection but no h/o retnetion.  Associated symptoms include nausea and abdominal pain. Pertinent negatives include no vomiting. There has been no fever. He has tried nothing for the symptoms. Sexual activity: non-contributory.  followed by Urology at [facility]  Past Medical History  Diagnosis Date  . Prostate ca   . Thyroid ca   . Hypertension   . Hyperlipemia   . Hernia     Past Surgical History  Procedure Date  . Joint replacement   . Prostate surgery   . Back surgery   . Hernia repair     History reviewed. No pertinent family history.  History  Substance Use Topics  . Smoking status: Never Smoker   . Smokeless tobacco: Not on file  . Alcohol Use: 0.6 oz/week    1 Glasses of wine per week      Review of Systems  Constitutional: Negative for fever and chills.  HENT: Negative for neck pain and neck stiffness.   Eyes: Negative for pain.  Respiratory: Negative for shortness of breath.   Cardiovascular: Negative for chest pain.  Gastrointestinal: Positive for nausea and abdominal pain. Negative for vomiting.  Genitourinary: Positive for urgency and penile pain. Negative for dysuria.  Musculoskeletal: Negative for back pain.  Skin: Negative for rash.  Neurological: Negative for headaches.  All other systems reviewed and are negative.    Physical Exam  BP 190/95  Pulse 81  Temp(Src) 98.1 F (36.7 C) (Oral)   Resp 16  Wt 160 lb (72.576 kg)  SpO2 100%  Physical Exam  Constitutional: He is oriented to person, place, and time. He appears well-developed and well-nourished.  HENT:  Head: Normocephalic and atraumatic.  Eyes: Conjunctivae and EOM are normal. Pupils are equal, round, and reactive to light.  Neck: Trachea normal and full passive range of motion without pain. Neck supple. No thyromegaly present.       No meningismus  Cardiovascular: Normal rate, regular rhythm, S1 normal, S2 normal, intact distal pulses and normal pulses.     No systolic murmur is present   No diastolic murmur is present  Pulses:      Radial pulses are 2+ on the right side, and 2+ on the left side.  Pulmonary/Chest: Effort normal and breath sounds normal. He has no wheezes. He has no rhonchi. He has no rales. He exhibits no tenderness.  Abdominal: Soft. Normal appearance and bowel sounds are normal. There is tenderness in the suprapubic area. There is no CVA tenderness and negative Murphy's sign.       Suprapubic fullness  Genitourinary: Penis normal. Right testis shows tenderness. Left testis shows no tenderness. Circumcised. No penile tenderness. No discharge found.  Musculoskeletal: Normal range of motion.       BLE:s Calves nontender, no cords or erythema, negative Homans sign  Neurological: He is alert and oriented to person, place, and time. He has normal strength and normal reflexes.  No cranial nerve deficit or sensory deficit. He displays a negative Romberg sign. GCS eye subscore is 4. GCS verbal subscore is 5. GCS motor subscore is 6.       Normal Gait  Skin: Skin is warm and dry. No rash noted. He is not diaphoretic. No cyanosis. Nails show no clubbing.  Psychiatric: He has a normal mood and affect. His speech is normal and behavior is normal.       Cooperative and appropriate    ED Course  Procedures  Urinary retention, foley attempted x 3 - 15French and then 14 French unable to pass and I also attempted  15F cude catheter.  Urology on call consulted. PT here with wife- plan transfer to [facility] for Urology to see in the ED for foley placement. PT prefers to go to [facility] at this time. I discussed PT with Dr Read Drivers at [facility] who accepts in transfer.  PTs wife will drive to ED private vehicle.   MDM Retention unable to pass foley, sent to [facility] for Urology care      Sunnie Nielsen, MD 04/06/11 [phone]

## 2011-04-06 NOTE — ED Notes (Signed)
Dr Read Drivers at [facility] ED aware of patient's condition and accepts this patient via pov, and will page Dr Laverle Patter (urology) when pt arrived to ED.

## 2011-04-06 NOTE — ED Notes (Signed)
Pt states unable to urinate, last void 2200.

## 2011-04-06 NOTE — ED Notes (Signed)
Dr Dierdre Highman has attempted x2, once with a 98fr coude cath and once with a regular 44fr cath.  Neither attempt was successful.  Pt has agreed to go by private vehicle to [place] [facility] and see a urologist there.

## 2011-04-08 NOTE — Consult Note (Signed)
NAMEELTON, HEID NO.:  [id number]  MEDICAL RECORD NO.:  [id number]  LOCATION:                                 FACILITY:  PHYSICIAN:  Heloise Purpura, MD      DATE OF BIRTH:  [birth year]  DATE OF CONSULTATION:  04/06/2011 DATE OF DISCHARGE:                                CONSULTATION   REASON FOR CONSULTATION:  Urinary retention and inability to place urethral catheter.  PHYSICIAN REQUESTING CONSULTATION:  Sunnie Nielsen, MD  HISTORY:  Mr. Olander is a 75 year old gentleman with a history of castrate-resistant metastatic prostate cancer, status post initial therapy in 2004 with a radical prostatectomy followed by adjuvant radiation therapy.  He has been on androgen deprivation therapy and developed castrate-resistant disease some time ago and has been treated with abiraterone.  His most recent PSA was apparently 42.  He remains relatively asymptomatic from his prostate cancer at this time and is currently followed by Dr. Maisie Fus in Medical Oncology and Dr. Leeroy Bock in Urology at [facility].  At approximately 11 o'clock last evening, he began having severe urge to void, but was unable to urinate at all.  He, therefore, presented to the [facility] Emergency Room and multiple attempts were made by the emergency room staff to place a catheter, which were unsuccessful.  He was therefore sent to [facility] Emergency Room for further urologic care.  He has never developed urinary retention in the past, although has had a relatively weak stream since his primary therapy back in 2004.  He has denied any recent hematuria or other problems voiding up until last evening.  PAST MEDICAL HISTORY: 1. Prostate cancer. 2. Hypercholesterolemia. 3. Hypertension. 4. Hypothyroidism.  PAST SURGICAL HISTORY:  Radical retropubic prostatectomy.  MEDICATIONS: 1. Valtrex. 2. Lipitor. 3. Lisinopril. 4. Maxzide 5. Synthroid. 6. Fish oil. 7.  Calcium. 8. Vitamin C. 9. Vitamin D 10.Vitamin E. 11.Aspirin.  ALLERGIES:  No known drug allergies.  FAMILY HISTORY:  No known GU malignancy.  SOCIAL HISTORY:  He is married and lives with his wife.  REVIEW OF SYSTEMS:  A complete review of systems was performed. Pertinent positives are as included in the history of present illness.  PHYSICAL EXAMINATION:  GENERAL:  The patient is currently afebrile with stable vital signs. CONSTITUTIONAL:  Alert and oriented and in moderate distress. HEENT:  Normocephalic, atraumatic. CARDIOVASCULAR:  Regular rate and rhythm. LUNGS:  Normal respiratory effort. ABDOMEN:  He does have suprapubic distention. GU:  Normal male phallus without urethral discharge.  Testes are descended bilaterally and are nontender without masses. EXTREMITIES:  No edema. NEUROLOGIC:  Grossly intact.  PROCEDURE:  The patient's genitalia was prepped and draped in the usual sterile fashion and an initial attempt was made to place a 12-French Foley catheter with resistance noted in the proximal urethra. Therefore, the 12-French catheter was removed and a 16-French flexiblecystoscope was passed into the urethra and there was noted to be a tight proximal urethral stricture with a pinpoint opening.  A 0.038 sensor guidewire was able to be advanced through this opening and presumably into the bladder.  The Ultraxx dilator balloon was then passed over the wire and was inflated  with sterile saline up to a pressure of 17 mmHg. This was left dilated for approximately 3 minutes.  The balloon was then deflated and removed and a 16-French Councill tip catheter was passed over the wire into the bladder with minimal resistance with return of grossly clear urine.  Approximately, 700 cc of urine was returned.  The patient subsequently developed relief.  He was administered 500 mg orally of Cipro for prophylaxis and was also administered Percocet orally for pain.  IMPRESSION:   Urinary retention secondary to urethral stricture.  RECOMMENDATIONS:  Mr. Taillon will be discharged home with catheter. He has been provided a prescription for Percocet as well as prophylactic Cipro for 24 hours.  I have recommended that he call the department of urology at [facility] today to schedule an appointment with Dr. Leeroy Bock in the next 1-2 weeks for a voiding trial and further evaluation.  Mr. Wyles and his wife were planning a trip to [place] and were set to leave in the next 48 hours.  I have told them that while it was reasonable to consider traveling with a Foley catheter as long as they felt comfortable taking care of the catheter and it was draining well over the next 48 hours, he does understand the potential risk for problems while traveling and the potential need to present to an emergency department while out of town.  He and his wife were going to determine whether he does think that he can travel and I have also recommend they discuss this with Dr. Leeroy Bock to get his recommendation.  He will follow up at [facility] in the next 1-2 weeks.     Heloise Purpura, MD     LB/MEDQ  D:  04/06/2011  T:  04/06/2011  Job:  [id number]  cc:   Dr. Novella Olive. Dola Argyle, NP [facility] [address] [place], [place] [address] Fax: [phone]  Electronically Signed by Heloise Purpura MD on 04/08/2011 06:44:42 PM

## 2011-05-15 DIAGNOSIS — N289 Disorder of kidney and ureter, unspecified: Secondary | ICD-10-CM | POA: Insufficient documentation

## 2011-05-15 DIAGNOSIS — E785 Hyperlipidemia, unspecified: Secondary | ICD-10-CM | POA: Insufficient documentation

## 2011-05-15 DIAGNOSIS — I499 Cardiac arrhythmia, unspecified: Secondary | ICD-10-CM | POA: Insufficient documentation

## 2011-05-15 DIAGNOSIS — D649 Anemia, unspecified: Secondary | ICD-10-CM | POA: Insufficient documentation

## 2011-05-31 LAB — BASIC METABOLIC PANEL
Calcium: 9.3
Creatinine, Ser: 1.18
GFR calc Af Amer: >60

## 2011-05-31 LAB — URINALYSIS, ROUTINE W REFLEX MICROSCOPIC
Leukocytes, UA: NEGATIVE
Nitrite: NEGATIVE
Specific Gravity, Urine: 1.018 (ref 1.005–1.035)
pH: 6

## 2011-05-31 LAB — DIFFERENTIAL
Basophils Relative: 1
Lymphs Abs: 1.9
Monocytes Relative: 9
Neutro Abs: 2.7
Neutrophils Relative %: 52

## 2011-05-31 LAB — CBC
RBC: 3.2 — ABNORMAL LOW
WBC: 5.2

## 2011-05-31 LAB — PROTIME-INR: INR: 1

## 2011-06-29 ENCOUNTER — Other Ambulatory Visit: Payer: Self-pay | Admitting: Dermatology

## 2011-08-16 DIAGNOSIS — H1710 Central corneal opacity, unspecified eye: Secondary | ICD-10-CM | POA: Insufficient documentation

## 2011-09-07 DIAGNOSIS — M8580 Other specified disorders of bone density and structure, unspecified site: Secondary | ICD-10-CM | POA: Insufficient documentation

## 2011-12-02 ENCOUNTER — Emergency Department (HOSPITAL_COMMUNITY)
Admission: EM | Admit: 2011-12-02 | Discharge: 2011-12-02 | Disposition: A | Discharge status: Home / Self Care | Payer: Medicare Other | Source: Home / Self Care | Attending: Emergency Medicine | Admitting: Emergency Medicine

## 2011-12-02 ENCOUNTER — Encounter (HOSPITAL_COMMUNITY): Payer: Self-pay

## 2011-12-02 DIAGNOSIS — L0390 Cellulitis, unspecified: Secondary | ICD-10-CM

## 2011-12-02 DIAGNOSIS — IMO0001 Reserved for inherently not codable concepts without codable children: Secondary | ICD-10-CM

## 2011-12-02 DIAGNOSIS — W5501XA Bitten by cat, initial encounter: Secondary | ICD-10-CM

## 2011-12-02 DIAGNOSIS — L0291 Cutaneous abscess, unspecified: Secondary | ICD-10-CM

## 2011-12-02 DIAGNOSIS — T148XXA Other injury of unspecified body region, initial encounter: Secondary | ICD-10-CM

## 2011-12-02 DIAGNOSIS — Z23 Encounter for immunization: Secondary | ICD-10-CM

## 2011-12-02 MED ORDER — AMOXICILLIN-POT CLAVULANATE 875-125 MG PO TABS: 1.0000 | ORAL_TABLET | Freq: Two times a day (BID) | ORAL | Status: AC

## 2011-12-02 MED ORDER — TETANUS-DIPHTH-ACELL PERTUSSIS 5-2.5-18.5 LF-MCG/0.5 IM SUSP
0.5000 mL | Freq: Once | INTRAMUSCULAR | Status: AC
  Administered 2011-12-02: 0.5 mL via INTRAMUSCULAR

## 2011-12-02 MED ORDER — TETANUS-DIPHTH-ACELL PERTUSSIS 5-2.5-18.5 LF-MCG/0.5 IM SUSP
INTRAMUSCULAR | Status: AC
  Filled 2011-12-02: qty 0.5

## 2011-12-02 NOTE — ED Notes (Signed)
Pt states his cat bit him on Thursday and woke Friday with pain and redness.  Area hot to touch and the size of baseball.  Not sure of last tetanus.

## 2011-12-02 NOTE — ED Provider Notes (Signed)
Chief Complaint  Patient presents with  . Wound Infection    History of Present Illness:  The patient is a 76 year old male who was bitten by his own cat on the right forearm 3 days ago. The patient was playing with his cat and states this was just a playful bite. The cat has not been sick and is an indoor cat and never goes outdoors. The next day it became red, swollen, and tender. It has not drained any pus. He denies any fever or chills. He cannot recall when his last tetanus vaccine was.  Review of Systems:  Other than noted above, the patient denies any of the following symptoms: Systemic:  No fever or chills. Musculoskeletal:  No joint pain or decreased range of motion. Neuro:  No numbness, tingling, or weakness.  PMFSH:  Past medical history, family history, social history, meds, and allergies were reviewed.  Physical Exam:   Vital signs:  BP 141/85  Pulse 98  Temp(Src) 98.3 F (36.8 C) (Oral)  Resp 18  SpO2 98% Ext:  There is a small, crusted over puncture wound on the lower aspect of the right forearm with surrounding erythema and tenderness. This measures 9 x 6 cm. A small amount of pus was expressed after the scab was removed. This was cultured.  All joints had a full ROM without pain.  Pulses were full.  Good capillary refill in all digits.  No edema. Neurological:  Alert and oriented.  No muscle weakness.  Sensation was intact to light touch.   Medications given in UCC:  He was given a Tdap vaccine.  Assessment:  The primary encounter diagnosis was Cat bite. A diagnosis of Cellulitis was also pertinent to this visit.  Plan:   1.  The following meds were prescribed:   New Prescriptions   AMOXICILLIN-CLAVULANATE (AUGMENTIN) 875-125 MG PER TABLET    Take 1 tablet by mouth 2 (two) times daily.   2.  The patient was instructed in wound care and pain control, and handouts were given. 3.  The patient was told to return as needed, if he should get any worse or was not better in  3 or 4 days.    Reuben Likes, MD 12/02/11 [phone]

## 2011-12-02 NOTE — Discharge Instructions (Signed)
Animal Bite  An animal bite can result in a scratch on the skin, deep open cut, puncture of the skin, crush injury, or tearing away of the skin or a body part. Dogs are responsible for most animal bites. Children are bitten more often than adults. An animal bite can range from very mild to more serious. A small bite from your house pet is no cause for alarm. However, some animal bites can become infected or injure a bone or other tissue. You must seek medical care if:  · The skin is broken and bleeding does not slow down or stop after 15 minutes.  · The puncture is deep and difficult to clean (such as a cat bite).  · Pain, warmth, redness, or pus develops around the wound.  · The bite is from a stray animal or rodent. There may be a risk of rabies infection.  · The bite is from a snake, raccoon, skunk, fox, coyote, or bat. There may be a risk of rabies infection.  · The person bitten has a chronic illness such as diabetes, liver disease, or cancer, or the person takes medicine that lowers the immune system.  · There is concern about the location and severity of the bite.  It is important to clean and protect an animal bite wound right away to prevent infection. Follow these steps:  · Clean the wound with plenty of water and soap.  · Apply an antibiotic cream.  · Apply gentle pressure over the wound with a clean towel or gauze to slow or stop bleeding.  · Elevate the affected area above the heart to help stop any bleeding.  · Seek medical care. Getting medical care within 8 hours of the animal bite leads to the best possible outcome.  DIAGNOSIS   Your caregiver will most likely:  · Take a detailed history of the animal and the bite injury.  · Perform a wound exam.  · Take your medical history.  Blood tests or X-rays may be performed. Sometimes, infected bite wounds are cultured and sent to a lab to identify the infectious bacteria.   TREATMENT   Medical treatment will depend on the location and type of animal bite as  well as the patient's medical history. Treatment may include:  · Wound care, such as cleaning and flushing the wound with saline solution, bandaging, and elevating the affected area.  · Antibiotics.  · Tetanus immunization.  · Rabies immunization.  · Leaving the wound open to heal. This is often done with animal bites, due to the high risk of infection. However, in certain cases, wound closure with stitches, wound adhesive, skin adhesive strips, or staples may be used.   Infected bites that are left untreated may require intravenous (IV) antibiotics and surgical treatment in the hospital.  HOME CARE INSTRUCTIONS  · Follow your caregiver's instructions for wound care.  · Take all medicines as directed.  · If your caregiver prescribes antibiotics, take them as directed. Finish them even if you start to feel better.  · Follow up with your caregiver for further exams or immunizations as directed.  You may need a tetanus shot if:  · You cannot remember when you had your last tetanus shot.  · You have never had a tetanus shot.  · The injury broke your skin.  If you get a tetanus shot, your arm may swell, get red, and feel warm to the touch. This is common and not a problem. If you need a tetanus   shot and you choose not to have one, there is a rare chance of getting tetanus. Sickness from tetanus can be serious.  SEEK MEDICAL CARE IF:  · You notice warmth, redness, soreness, swelling, pus discharge, or a bad smell coming from the wound.  · You have a red line on the skin coming from the wound.  · You have a fever, chills, or a general ill feeling.  · You have nausea or vomiting.  · You have continued or worsening pain.  · You have trouble moving the injured part.  · You have other questions or concerns.  MAKE SURE YOU:  · Understand these instructions.  · Will watch your condition.  · Will get help right away if you are not doing well or get worse.  Document Released: 04/18/2011 Document Revised: 07/20/2011 Document  Reviewed: 04/18/2011  ExitCare® Patient Information ©2012 [employer].

## 2011-12-05 LAB — WOUND CULTURE: Gram Stain: NONE SEEN

## 2011-12-06 NOTE — ED Notes (Signed)
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  Adequately treated at visit with Augmentin.

## 2012-01-17 DIAGNOSIS — H179 Unspecified corneal scar and opacity: Secondary | ICD-10-CM | POA: Insufficient documentation

## 2012-04-17 DIAGNOSIS — C61 Malignant neoplasm of prostate: Secondary | ICD-10-CM | POA: Insufficient documentation

## 2012-09-11 DIAGNOSIS — H5210 Myopia, unspecified eye: Secondary | ICD-10-CM | POA: Insufficient documentation

## 2013-02-22 ENCOUNTER — Emergency Department (INDEPENDENT_AMBULATORY_CARE_PROVIDER_SITE_OTHER): Payer: Medicare Other

## 2013-02-22 ENCOUNTER — Emergency Department (HOSPITAL_COMMUNITY)
Admission: EM | Admit: 2013-02-22 | Discharge: 2013-02-22 | Disposition: A | Discharge status: Home / Self Care | Payer: Medicare Other | Source: Home / Self Care | Attending: Emergency Medicine | Admitting: Emergency Medicine

## 2013-02-22 ENCOUNTER — Encounter (HOSPITAL_COMMUNITY): Payer: Self-pay | Admitting: *Deleted

## 2013-02-22 DIAGNOSIS — S90121A Contusion of right lesser toe(s) without damage to nail, initial encounter: Secondary | ICD-10-CM

## 2013-02-22 DIAGNOSIS — S90129A Contusion of unspecified lesser toe(s) without damage to nail, initial encounter: Secondary | ICD-10-CM

## 2013-02-22 NOTE — ED Provider Notes (Signed)
History    CSN: [id number] Arrival date & time 02/22/13  1034  None    No chief complaint on file.  (Consider location/radiation/quality/duration/timing/severity/associated sxs/prior Treatment) Patient is a 77 y.o. male presenting with toe pain. The history is provided by the patient. No language interpreter was used.  Toe Pain This is a new problem. The problem has been gradually worsening. Nothing aggravates the symptoms. He has tried a cold compress for the symptoms. The treatment provided no relief.   Past Medical History  Diagnosis Date  . Prostate ca   . Thyroid ca   . Hypertension   . Hyperlipemia   . Hernia    Past Surgical History  Procedure Laterality Date  . Joint replacement    . Prostate surgery    . Back surgery    . Hernia repair     No family history on file. History  Substance Use Topics  . Smoking status: Never Smoker   . Smokeless tobacco: Not on file  . Alcohol Use: 0.6 oz/week    1 Glasses of wine per week    Review of Systems  Musculoskeletal: Positive for myalgias and joint swelling.  Skin: Positive for wound.    Allergies  Review of patient's allergies indicates no known allergies.  Home Medications   Current Outpatient Rx  Name  Route  Sig  Dispense  Refill  . aspirin 81 MG tablet   Oral   Take 81 mg by mouth daily.           Marland Kitchen atorvastatin (LIPITOR) 20 MG tablet   Oral   Take 20 mg by mouth daily.           . cholecalciferol (VITAMIN D) 1000 UNITS tablet   Oral   Take 1,000 Units by mouth daily.           . fish oil-omega-3 fatty acids 1000 MG capsule   Oral   Take 1 g by mouth daily.           Marland Kitchen levothyroxine (SYNTHROID, LEVOTHROID) 150 MCG tablet   Oral   Take 150 mcg by mouth daily.           Marland Kitchen lisinopril (PRINIVIL,ZESTRIL) 5 MG tablet   Oral   Take 5 mg by mouth daily.           Marland Kitchen triamterene-hydrochlorothiazide (MAXZIDE) 75-50 MG per tablet   Oral   Take 1 tablet by mouth daily.           .  valACYclovir (VALTREX) 1000 MG tablet   Oral   Take 1,000 mg by mouth 2 (two) times daily.           . vitamin C (ASCORBIC ACID) 500 MG tablet   Oral   Take 500 mg by mouth daily.           . vitamin E 400 UNIT capsule   Oral   Take 400 Units by mouth daily.            BP 161/89  Pulse 70  Temp(Src) 98.3 F (36.8 C) (Oral)  Resp 16  SpO2 98% Physical Exam  Vitals reviewed. Constitutional: He appears well-developed and well-nourished.  Neck: Normal range of motion.  Abdominal: Soft.  Musculoskeletal: He exhibits tenderness.  Tender left foot 4th and 5th toes    ED Course  Procedures (including critical care time) Labs Reviewed - No data to display No results found. 1. Contusion, toe, right, initial encounter  MDM  No fx,    Ice, ubuproen.   Return if any  problems,  Elson Areas, PA-C 02/22/13 [address] [place], PA-C 02/22/13 1112  Lonia Skinner [place], [place] 02/22/13 1145

## 2013-02-22 NOTE — ED Provider Notes (Signed)
Medical screening examination/treatment/procedure(s) were performed by non-physician practitioner and as supervising physician I was immediately available for consultation/collaboration.  Raynald Blend, MD 02/22/13 1202

## 2013-02-22 NOTE — ED Notes (Signed)
Pt  Reports     he  inj    his  l  Foot      Last  Week  He  Reports he snaggeg  His  Toe  whild  Putting on pants  And  Felled  Against  Bathtub  Thus  Striking   It  With  His  l  Foot   He  Has  Pain on palpation and  On  Weight  Bearing

## 2013-03-06 ENCOUNTER — Other Ambulatory Visit: Payer: Self-pay | Admitting: Dermatology

## 2013-04-02 ENCOUNTER — Other Ambulatory Visit: Payer: Self-pay | Admitting: Dermatology

## 2013-04-02 DIAGNOSIS — Z961 Presence of intraocular lens: Secondary | ICD-10-CM | POA: Insufficient documentation

## 2013-06-20 ENCOUNTER — Encounter (INDEPENDENT_AMBULATORY_CARE_PROVIDER_SITE_OTHER): Payer: Self-pay

## 2013-06-20 ENCOUNTER — Ambulatory Visit (HOSPITAL_COMMUNITY)
Admission: RE | Admit: 2013-06-20 | Discharge: 2013-06-20 | Disposition: A | Discharge status: Home / Self Care | Payer: Medicare Other | Source: Ambulatory Visit | Attending: Vascular Surgery | Admitting: Vascular Surgery

## 2013-06-20 ENCOUNTER — Other Ambulatory Visit (HOSPITAL_COMMUNITY): Payer: Self-pay | Admitting: Endocrinology

## 2013-06-20 DIAGNOSIS — I739 Peripheral vascular disease, unspecified: Secondary | ICD-10-CM | POA: Insufficient documentation

## 2013-07-22 ENCOUNTER — Encounter: Payer: Self-pay | Admitting: Vascular Surgery

## 2013-08-18 ENCOUNTER — Encounter: Payer: Self-pay | Admitting: Vascular Surgery

## 2013-08-19 ENCOUNTER — Encounter: Payer: Self-pay | Admitting: Vascular Surgery

## 2013-08-19 ENCOUNTER — Ambulatory Visit (INDEPENDENT_AMBULATORY_CARE_PROVIDER_SITE_OTHER): Payer: Medicare Other | Admitting: Vascular Surgery

## 2013-08-19 VITALS — BP 133/72 | HR 68 | Ht 70.0 in | Wt 178.0 lb

## 2013-08-19 DIAGNOSIS — I739 Peripheral vascular disease, unspecified: Secondary | ICD-10-CM

## 2013-08-19 NOTE — Progress Notes (Signed)
Patient name: [name] MRN: [id number] DOB: 1936-07-15 Sex: male   Referred by: Dr. Forde Dandy  Reason for referral:  Chief Complaint  Patient presents with  . New Evaluation    poor circulation bilateral LE - pt c/o "weird feeling" in both leg    HISTORY OF PRESENT ILLNESS: The patient presents today for evaluation of discoloration and strain sensation of feeling in both lower extremities. He's had a long history of progressive discoloration in both feet. He reports that this has been slowly progressive over time and notes that he has more purplish discoloration when the legs are dependent. He does not have any lower extremity swelling and has had no tissue loss. He reports a sensation of a crawling feeling in both feet. He did undergo noninvasive vascular laboratory studies in November of 2014 and had these for review. He does not have any symptoms about arterial rest pain or claudication  Past Medical History  Diagnosis Date  . Hypertension   . Hyperlipemia   . Hernia   . Prostate ca   . 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 Obstructive nephropathy   . Anemia     Past Surgical History  Procedure Laterality Date  . Joint replacement    . Prostate surgery    . Back surgery  1986    Lumbar Laminectomy  . Hernia repair  [id number]  [id number]    Umbilical and Left inguinal   . Eye surgery Bilateral     Cataract  . Colon polyps  Nov. 13, 2006  . Prostatectomy  2004    Radical   . Spine surgery  1986    Lumbar laminectomy  . Thyroid lobectomy Bilateral   . Skin cancer excision  Nov. 10, 2011    Left scalp and Left forearm- SCC  . Cataract extraction Bilateral R eye 03/2013, L eye09/2014     History   Social History  . Marital Status: Married    Spouse Name: N/A    Number of Children: N/A  . Years of Education: N/A   Occupational History  . Not on file.   Social History  Main Topics  . Smoking status: Former Smoker    Quit date: 08/14/1984  . Smokeless tobacco: Never Used  . Alcohol Use: 4.2 - 6 oz/week    7-10 Glasses of wine per week  . Drug Use: No  . Sexual Activity: No   Other Topics Concern  . Not on file   Social History Narrative  . No narrative on file    Family History  Problem Relation Age of Onset  . Kidney disease Mother     ESRD  . Stroke Mother   . Cancer Mother   . Heart disease Father     Allergies as of 08/19/2013  . (No Known Allergies)    Current Outpatient Prescriptions on File Prior to Visit  Medication Sig Dispense Refill  . aspirin 81 MG tablet Take 81 mg by mouth daily.        Marland Kitchen atorvastatin (LIPITOR) 20 MG tablet Take 20 mg by mouth daily.        . cholecalciferol (VITAMIN D) 1000 UNITS tablet Take 1,000 Units by mouth daily.        . clorazepate (TRANXENE) 7.5 MG tablet Take 7.5 mg by mouth 2 (two) times  daily as needed for anxiety.      . fish oil-omega-3 fatty acids 1000 MG capsule Take 1 g by mouth daily.        Marland Kitchen levothyroxine (SYNTHROID, LEVOTHROID) 150 MCG tablet Take 150 mcg by mouth. 150 mcg T, TH, Sat, Sun  1/2 tablet M, W, F      . lisinopril (PRINIVIL,ZESTRIL) 5 MG tablet Take 5 mg by mouth daily.        Marland Kitchen omeprazole (PRILOSEC) 20 MG capsule Take 20 mg by mouth as needed.       . triamterene-hydrochlorothiazide (MAXZIDE) 75-50 MG per tablet Take 1 tablet by mouth daily.        . valACYclovir (VALTREX) 1000 MG tablet Take 1,000 mg by mouth daily.       . vitamin C (ASCORBIC ACID) 500 MG tablet Take 500 mg by mouth daily.        . vitamin E 400 UNIT capsule Take 400 Units by mouth daily.         No current facility-administered medications on file prior to visit.     REVIEW OF SYSTEMS:  Positives indicated with an "X"  CARDIOVASCULAR:  [ ]  chest pain   [ ]  chest pressure   [x ] palpitations   [ ]  orthopnea   [ ]  dyspnea on exertion   [ ]  claudication   [ ]  rest pain   [ ]  DVT   [ ]   phlebitis PULMONARY:   [ ]  productive cough   [ ]  asthma   [ ]  wheezing NEUROLOGIC:   [ ]  weakness  [ ]  paresthesias  [ ]  aphasia  [ ]  amaurosis  [ ]  dizziness HEMATOLOGIC:   [ ]  bleeding problems   [ ]  clotting disorders MUSCULOSKELETAL:  [ ]  joint pain   [ ]  joint swelling GASTROINTESTINAL: [ ]   blood in stool  [ ]   hematemesis GENITOURINARY:  [ ]   dysuria  [ ]   hematuria PSYCHIATRIC:  [ ]  history of major depression INTEGUMENTARY:  [ ]  rashes  [ ]  ulcers CONSTITUTIONAL:  [ ]  fever   [ ]  chills  PHYSICAL EXAMINATION:  General: The patient is a well-nourished male, in no acute distress. Vital signs are BP 133/72  Pulse 68  Ht 5\' 10"  (1.778 m)  Wt 178 lb (80.74 kg)  BMI 25.54 kg/m2  SpO2 100% Pulmonary: There is a good air exchange bilaterally without wheezing or rales. Abdomen: Soft and non-tender with normal pitch bowel sounds. No aneurysm palpable Musculoskeletal: There are no major deformities.  There is no significant extremity pain. Neurologic: No focal weakness or paresthesias are detected, Skin: Bluish discoloration of matting of telangiectasia on both feet not extending above the ankle Psychiatric: The patient has normal affect. Cardiovascular: There is a regular rate and rhythm without significant murmur appreciated.   [facility] Vascular Lab Studies:  From November 2014 revealed normal ankle arm index bilaterally. He did have slightly diminished waveforms which were biphasic on the left and triphasic on the right  Impression and Plan:  No evidence of arterial disease and no evidence of significant venous hypertension. Had a long discussion with the patient and his wife present. I explained that this is a matting of venous telangiectasia over both feet. Unclear as to the etiology of this. I do not feel that this is causing the strange sensation that he has on his feet. I reassured the patient that this is not limb threatening and requires no treatment. I explained the  only  treatment would be compression garments and certainly would not recommend this since he is not having any evidence of venous hypertension and no significant symptoms related to this. I suspect this will be progressive with further discoloration over time but do not feel this does have any significant health risk. He was reassured this discussion will see [place] again on an as-needed basis    Greyson Peavy [facility] Office: [phone]

## 2013-08-22 ENCOUNTER — Encounter: Payer: Self-pay | Admitting: Endocrinology

## 2013-10-30 DIAGNOSIS — D7589 Other specified diseases of blood and blood-forming organs: Secondary | ICD-10-CM | POA: Insufficient documentation

## 2014-05-04 ENCOUNTER — Other Ambulatory Visit: Payer: Self-pay | Admitting: Dermatology

## 2014-07-25 ENCOUNTER — Encounter (HOSPITAL_COMMUNITY): Payer: Self-pay | Admitting: Emergency Medicine

## 2014-07-25 ENCOUNTER — Emergency Department (HOSPITAL_COMMUNITY)
Admission: EM | Admit: 2014-07-25 | Discharge: 2014-07-25 | Disposition: A | Discharge status: Home / Self Care | Payer: Medicare Other | Source: Home / Self Care | Attending: Family Medicine | Admitting: Family Medicine

## 2014-07-25 DIAGNOSIS — S6992XA Unspecified injury of left wrist, hand and finger(s), initial encounter: Secondary | ICD-10-CM

## 2014-07-25 NOTE — ED Provider Notes (Signed)
CSN: [id number]     Arrival date & time 07/25/14  1526 History   First MD Initiated Contact with Patient 07/25/14 1540     Chief Complaint  Patient presents with  . Extremity Laceration   (Consider location/radiation/quality/duration/timing/severity/associated sxs/prior Treatment) Patient is a 78 y.o. male presenting with hand injury. The history is provided by the patient.  Hand Injury Location:  Hand Injury: yes   Mechanism of injury comment:  Crush injury to left thenar eminencefrom piece of furniture pinching  skin. Hand location:  L palm Pain details:    Quality:  Dull   Radiates to:  Does not radiate   Severity:  Mild   Duration:  2 hours   Progression:  Unchanged Chronicity:  New Handedness:  Left-handed Dislocation: no   Tetanus status:  Up to date Prior injury to area:  No Associated symptoms: no decreased range of motion and no numbness     Past Medical History  Diagnosis Date  . Hypertension   . Hyperlipemia   . Hernia   . Prostate ca   . Thyroid ca 3267    Follicular Variant of Papillary CA   . 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 Obstructive nephropathy   . Anemia    Past Surgical History  Procedure Laterality Date  . Joint replacement    . Prostate surgery    . Back surgery  1986    Lumbar Laminectomy  . Hernia repair  1245,  8099    Umbilical and Left inguinal   . Eye surgery Bilateral     Cataract  . Colon polyps  Nov. 13, 2006  . Prostatectomy  2004    Radical   . Spine surgery  1986    Lumbar laminectomy  . Thyroid lobectomy Bilateral   . Skin cancer excision  Nov. 10, 2011    Left scalp and Left forearm- SCC  . Cataract extraction Bilateral R eye 03/2013, L eye09/2014    Family History  Problem Relation Age of Onset  . Kidney disease Mother     ESRD  . Stroke Mother   . Cancer Mother   . Heart disease Father    History  Substance Use Topics  . Smoking status: Former Smoker    Quit date:  08/14/1984  . Smokeless tobacco: Never Used  . Alcohol Use: 4.2 - 6.0 oz/week    7-10 Glasses of wine per week    Review of Systems  Constitutional: Negative.   Skin: Positive for wound.    Allergies  Review of patient's allergies indicates no known allergies.  Home Medications   Prior to Admission medications   Medication Sig Start Date End Date Taking? Authorizing Provider  aspirin 81 MG tablet Take 81 mg by mouth daily.      Historical Provider, MD  atorvastatin (LIPITOR) 20 MG tablet Take 20 mg by mouth daily.      Historical Provider, MD  Biotin 2500 MCG CAPS Take 1 capsule by mouth daily.    Historical Provider, MD  cholecalciferol (VITAMIN D) 1000 UNITS tablet Take 1,000 Units by mouth daily.      Historical Provider, MD  clorazepate (TRANXENE) 7.5 MG tablet Take 7.5 mg by mouth 2 (two) times daily as needed for anxiety.    Historical Provider, MD  Cyanocobalamin (VITAMIN B-12 IJ) Inject as directed every 30 (thirty) days.    Historical Provider, MD  fish oil-omega-3 fatty acids 1000  MG capsule Take 1 g by mouth daily.      Historical Provider, MD  levothyroxine (SYNTHROID, LEVOTHROID) 150 MCG tablet Take 150 mcg by mouth. 150 mcg T, TH, Sat, Sun  1/2 tablet M, W, F    Historical Provider, MD  lisinopril (PRINIVIL,ZESTRIL) 5 MG tablet Take 5 mg by mouth daily.      Historical Provider, MD  omeprazole (PRILOSEC) 20 MG capsule Take 20 mg by mouth as needed.     Historical Provider, MD  triamterene-hydrochlorothiazide (MAXZIDE) 75-50 MG per tablet Take 1 tablet by mouth daily.      Historical Provider, MD  valACYclovir (VALTREX) 1000 MG tablet Take 1,000 mg by mouth daily.     Historical Provider, MD  vitamin C (ASCORBIC ACID) 500 MG tablet Take 500 mg by mouth daily.      Historical Provider, MD  vitamin E 400 UNIT capsule Take 400 Units by mouth daily.      Historical Provider, MD   BP 149/82 mmHg  Pulse 83  Temp(Src) 98.8 F (37.1 C) (Oral)  Resp 16  SpO2 99% Physical  Exam  Constitutional: He is oriented to person, place, and time. He appears well-developed and well-nourished.  Musculoskeletal: Normal range of motion. He exhibits no tenderness.  Neurological: He is alert and oriented to person, place, and time.  Skin: Skin is warm and dry.  Superficial skin flap to thenar palm, nvt intact, no active bleeding, skin flap debrided ,wound care and dsg .  Nursing note and vitals reviewed.   ED Course  Procedures (including critical care time) Labs Review Labs Reviewed - No data to display  Imaging Review No results found.   MDM   1. Hand injury, left, initial encounter        Billy Fischer, MD 07/25/14 [phone]

## 2014-07-25 NOTE — Discharge Instructions (Signed)
Leave bandaged until Monday then wash and cover as needed.

## 2014-07-25 NOTE — ED Notes (Signed)
Pt states that he was moving furniture and that his eye got pinched by a cabinet which caused a laceration to the left hand near the thumb.

## 2014-11-02 ENCOUNTER — Other Ambulatory Visit: Payer: Self-pay | Admitting: Dermatology

## 2015-01-13 DIAGNOSIS — F03918 Unspecified dementia, unspecified severity, with other behavioral disturbance: Secondary | ICD-10-CM | POA: Insufficient documentation

## 2015-01-13 DIAGNOSIS — F0391 Unspecified dementia with behavioral disturbance: Secondary | ICD-10-CM | POA: Insufficient documentation

## 2016-04-25 ENCOUNTER — Ambulatory Visit (INDEPENDENT_AMBULATORY_CARE_PROVIDER_SITE_OTHER): Payer: Medicare Other | Admitting: Neurology

## 2016-04-25 ENCOUNTER — Encounter: Payer: Self-pay | Admitting: Neurology

## 2016-04-25 VITALS — BP 127/78 | HR 53 | Ht 71.0 in | Wt 181.4 lb

## 2016-04-25 DIAGNOSIS — F0280 Dementia in other diseases classified elsewhere without behavioral disturbance: Secondary | ICD-10-CM

## 2016-04-25 DIAGNOSIS — G301 Alzheimer's disease with late onset: Secondary | ICD-10-CM | POA: Diagnosis not present

## 2016-04-25 MED ORDER — DIVALPROEX SODIUM ER 500 MG PO TB24: 500.0000 mg | ORAL_TABLET | Freq: Every day | ORAL | 3 refills | Status: DC

## 2016-04-25 NOTE — Patient Instructions (Signed)
I had a long discussion with the patient and his wife regarding his Alzheimer's dementia and recent.agitation issues. I recommend a trial of Depakote ER 500 mg daily to help with his mood and behavior. Continue Aricept 10 mg daily and Namenda 6 are 20 and milligrams daily. Consider possible participation in the [place] 2 dementia trial if interested. I also suggested a driving evaluation and the driver's rehabilitation clinic to assess his competency to drive He'll return for follow-up in 2 months or call earlier if necessary.  Alzheimer Disease Alzheimer disease is a mental disorder. It causes memory loss and loss of other mental functions, such as learning, thinking, problem solving, communicating, and completing tasks. The mental losses interfere with the ability to perform daily activities at work, at home, or in social situations. Alzheimer disease usually starts in a person's late 5s or early 58s but can start earlier in life (familial form). The mental changes caused by this disease are permanent and worsen over time. As the illness progresses, the ability to do even the simplest things is lost. Survival with Alzheimer disease ranges from several years to as long as 20 years. CAUSES Alzheimer disease is caused by abnormally high levels of a protein (beta-amyloid) in the brain. This protein forms very small deposits within and around the brain's nerve cells. These deposits prevent the nerve cells from working properly. Experts are not certain what causes the beta-amyloid deposits in this disease. RISK FACTORS The following major risk factors have been identified:  Increasing age.  Certain genetic variations, such as Down syndrome (trisomy 21). SYMPTOMS In the early stages of Alzheimer disease, you are still able to perform daily activities but need greater effort, more time, or memory aids. Early symptoms include:  Mild memory loss of recent events, names, or phone numbers.  Loss of  objects.  Minor loss of vocabulary.  Difficulty with complex tasks, such as paying bills or driving in unfamiliar locations. Other mental functions deteriorate as the disease worsens. These changes slowly go from mild to severe. Symptoms at this stage include:  Difficulty remembering. You may not be able to recall personal information such as your address and telephone number. You may become confused about the date, the season of the year, or your location.  Difficulty maintaining attention. You may forget what you wanted to say during conversations and repeat what you have already said.  Difficulty learning new information or tasks. You may not remember what you read or the name of a new friend you met.  Difficulty counting or doing math. You may have difficulty with complex math problems. You may make mistakes in paying bills or managing your checkbook.  Poor reasoning and judgment. You may make poor decisions or not dress right for the weather.  Difficulty communicating. You may have regular difficulty remembering words, naming objects, expressing yourself clearly, or writing sentences that make sense.  Difficulty performing familiar daily activities. You may get lost driving in familiar locations or need help eating, bathing, dressing, grooming, or using the toilet. You may have difficulty maintaining bladder or bowel control.  Difficulty recognizing familiar faces. You may confuse family members or close friends with one another. You may not recognize a close relative or may mistake strangers for family. Alzheimer disease also may cause changes in personality and behavior. These changes include:   Loss of interest or motivation.  Social withdrawal.  Anxiety.  Difficulty sleeping.  Uncharacteristic anger or combativeness.  A false belief that someone is trying to  harm you (paranoia).  Seeing things that are not real (hallucinations).  Agitation. Confusion and disruptive  behavior are often worse at night and may be triggered by changes in the environment or acute medical issues. DIAGNOSIS  Alzheimer disease is diagnosed through an assessment by your health care provider. During this assessment, your health care provider will do the following:  Ask you and your family, friends, or caregivers questions about your symptoms, their frequency, their duration and progression, and the effect they are having on your life.  Ask questions about your personal and family medical history and use of alcohol or drugs, including prescription medicine.  Perform a physical exam and order blood tests and brain imaging exams. Your health care provider may refer you to a specialist for detailed evaluation of your mental functions (neuropsychological testing).  Many different brain disorders, medical conditions, and certain substances can cause symptoms that resemble Alzheimer disease symptoms. These must be ruled out before this disease can be diagnosed. If Alzheimer disease is diagnosed, it will be considered either "possible" or "probable" Alzheimer disease. "Possible" Alzheimer disease means that your symptoms are typical of the disease and no other disorder is causing them. "Probable" Alzheimer disease means that you also have a family history of the disease or genetic test results that support the diagnosis. Certain tests, mostly used in research studies, are highly specific for Alzheimer disease.  TREATMENT  There is currently no cure for this disease. The goals of treatment are to:  Slow down the progression of the disease.  Preserve mental function as long as possible.  Manage behavioral symptoms.  Make life easier for the person with Alzheimer disease and his or her caregivers. The following treatment options are available:  Medicine. Certain medicines may help slow memory loss by changing the level of certain chemicals in the brain. Medicine may also help with behavioral  symptoms.  Talk therapy. Talk therapy provides education, support, and memory aids for people with this disease. It is most effective in the early stages of the illness.  Caregiving. Caregivers may be family members, friends, or trained medical professionals. They help the person with Alzheimer disease with daily life activities. Caregiving may take place at home or at a nursing facility.  Family support groups. These provide education, emotional support, and information about community resources to family members who are taking care of the person with this disease.   This information is not intended to replace advice given to you by your health care provider. Make sure you discuss any questions you have with your health care provider.   Document Released: 04/11/2004 Document Revised: 08/21/2014 Document Reviewed: 12/06/2012 Elsevier Interactive Patient Education [employer].

## 2016-04-25 NOTE — Progress Notes (Addendum)
[facility] [address] [place]. [place] [address] [phone]       OFFICE CONSULT NOTE  [name] Date of Birth:  [birth year] Medical Record Number:  [id number]   Referring MD:  Reynold Bowen  Reason for Referral:  dementia HPI: [name] is a 80 year old Caucasian male who is accompanied today by his wife who provides most of his history. Patient has a history of progressive dementia for the last 4 years. He has previously been seeing Dr. Ileene Rubens at [facility] but after his untimely death recently patient asked for a referral to see a neurologist closer to home and was referred to me. The patient has had gradually progressive cognitive decline and was initially started on Aricept 2 years ago and subsequently Namenda was added a year and a half ago. He is currently on Aricept 10 mg daily and Namenda etc. 28 mg daily. He is living at home with his wife and is mostly independent but does have trouble with finances as well as with finding things at home. He often misplaces objects and gets easily frustrated and he cannot find them. He also at times has trouble following instructions and selecting is close to go out. He can dress himself and go to the toilet by himself. The patient's wife is. On a number of occasions his house to open her and a couple of occasions left the car door open and drain the battery. The patient also needs constant reminders but gets frustrated. He often gets distracted easily and cannot carry through the activity was doing. He was last seen by his neurologist Dr. Thana Ates in December 2016 and at that time MRI of the brain in 01/2015 demonstrated diffuse atrophy, white matter changes and microhemorrhages. There was an area of T2 shine through in the right frontal lobe. His MOCA score was 16/30  Today and clock drawing 1/4. He was however able to name 13 animals. His recall was 0/3.     Marland Kitchen Patient has no  known history of strokes TIAs seizures or other neurological problems. The patient's wife is unable to tell me why but he had an MRI scan of the brain done on 01/28/15 and I have the report which shows subtle diffusion in positive lesion in the right periventricular white matter which has been raised as questionable subacute infarct but patient apparently had no symptoms from this. I do not have the actual films to look at today. Patient has not been on medications like Depakote or other medications for agitation. The patient wants to drive and patient wife is concerned about it and I have suggested referral to driver's rehabilitation for real-time assessment of his driving ability on the road. ROS:   14 system review of systems is positive for  ringing in the ears, snoring, memory loss, confusion, agitation, frustration, irritation, runny nose, joint pain, snoring and all other systems negative PMH:  Past Medical History:  Diagnosis Date  . Anemia   . Hernia   . Hyperlipemia   . Hypertension   . Memory loss   . Obstructive nephropathy   . Prostate CA ([place])   . 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 Marital status: Married    Spouse name: N/A  . Number of  children: N/A  . Years of education: N/A   Occupational History  . Not on file.   Social History Main Topics  . Smoking status: Former Smoker    Quit date: 08/14/1984  . Smokeless tobacco: Never Used  . Alcohol use 4.2 - 6.0 oz/week    7 - 10 Glasses of wine per week     Comment: everyday  . Drug use: No  . Sexual activity: No   Other Topics Concern  . Not on file   Social History Narrative  . No narrative on file    Medications:   Current Outpatient Prescriptions on File Prior to Visit  Medication Sig Dispense Refill  . aspirin 81 MG tablet Take 81 mg by mouth daily.        Marland Kitchen atorvastatin (LIPITOR) 20 MG tablet Take 20 mg by mouth daily.      . Cyanocobalamin (VITAMIN B-12 IJ) Inject as directed every 30 (thirty) days.    Marland Kitchen levothyroxine (SYNTHROID, LEVOTHROID) 150 MCG tablet Take 150 mcg by mouth. 150 mcg T, TH, Sat, Sun  1/2 tablet M, W, F    . lisinopril (PRINIVIL,ZESTRIL) 5 MG tablet Take 5 mg by mouth daily.      Marland Kitchen triamterene-hydrochlorothiazide (MAXZIDE) 75-50 MG per tablet Take 1 tablet by mouth daily.       No current facility-administered medications on file prior to visit.     Allergies:  No Known Allergies  Physical Exam General: well developed, well nourished elderly Caucasian male, seated, in no evident distress Head: head normocephalic and atraumatic.   Neck: supple with no carotid or supraclavicular bruits Cardiovascular: regular rate and rhythm, no murmurs Musculoskeletal: no deformity Skin:  no rash/petichiae Vascular:  Normal pulses all extremities  Neurologic Exam Mental Status: Awake and fully alert. Oriented to place and time. Recent and remote memory poor Attention span, concentration and fund of knowledge diminished. . Mood and affect appropriate. Montreal cognitive assessment test scored 16/30 With deficits in clock drawing, naming, delayed recall.. Clock drawing 1/4. Geriatric depression score 0 not depressed. Animal naming 13. Cranial Nerves: Fundoscopic exam reveals sharp disc margins. Pupils equal, briskly reactive to light. Extraocular movements full without nystagmus. Visual fields full to confrontation. Hearing intact. Facial sensation intact. Face, tongue, palate moves normally and symmetrically.  Motor: Normal bulk and tone. Normal strength in all tested extremity muscles. Sensory.: intact to touch , pinprick , position and vibratory sensation.  Coordination: Rapid alternating movements normal in all extremities. Finger-to-nose and heel-to-shin performed accurately bilaterally. Gait and Station: Arises from chair without  difficulty. Stance is normal. Gait demonstrates normal stride length and balance . Able to heel, toe and tandem walk without difficulty.  Reflexes: 1+ and symmetric. Toes downgoing.       ASSESSMENT:  80 year old Caucasian male with progressive dementia likely of Alzheimer's type. Deficits mostly and is executive function, visual spatial skills and recent increase in behavioral agitation.   PLAN: I had a long discussion with the patient and his wife regarding his Alzheimer's dementia and recent.agitation issues. I recommend a trial of Depakote ER 500 mg daily to help with his mood and behavior. Continue Aricept 10 mg daily and Namenda XR 28 milligrams daily. Consider possible participation in the [facility] 2 dementia trial if interested. I also suggested  that he should not drive alone and a driving evaluation and the driver's rehabilitation clinic to assess his competency to drive. The patient is likely to need additional support at home. Greater than 50% time  during this 45 minute consultation visit was spent on counseling and coordination of care about his dementia and discussion with wife He'll return for follow-up in 2 months or call earlier if necessary Antony Contras, MD  [facility] [address] [place], [place] [address]  Phone [phone] Fax [phone] Note: This document was prepared with digital dictation and possible smart phrase technology. Any transcriptional errors that result from this process are unintentional.

## 2016-07-04 ENCOUNTER — Ambulatory Visit: Payer: Medicare Other | Admitting: Neurology

## 2017-10-20 ENCOUNTER — Emergency Department (HOSPITAL_COMMUNITY)
Admission: EM | Admit: 2017-10-20 | Discharge: 2017-10-20 | Disposition: A | Discharge status: Home / Self Care | Payer: Medicare Other | Attending: Emergency Medicine | Admitting: Emergency Medicine

## 2017-10-20 ENCOUNTER — Other Ambulatory Visit: Payer: Self-pay

## 2017-10-20 ENCOUNTER — Emergency Department (HOSPITAL_COMMUNITY): Payer: Medicare Other

## 2017-10-20 DIAGNOSIS — Z85828 Personal history of other malignant neoplasm of skin: Secondary | ICD-10-CM | POA: Diagnosis not present

## 2017-10-20 DIAGNOSIS — W19XXXA Unspecified fall, initial encounter: Secondary | ICD-10-CM | POA: Insufficient documentation

## 2017-10-20 DIAGNOSIS — Z8546 Personal history of malignant neoplasm of prostate: Secondary | ICD-10-CM | POA: Diagnosis not present

## 2017-10-20 DIAGNOSIS — Y939 Activity, unspecified: Secondary | ICD-10-CM | POA: Insufficient documentation

## 2017-10-20 DIAGNOSIS — Z7982 Long term (current) use of aspirin: Secondary | ICD-10-CM | POA: Diagnosis not present

## 2017-10-20 DIAGNOSIS — Z79899 Other long term (current) drug therapy: Secondary | ICD-10-CM | POA: Insufficient documentation

## 2017-10-20 DIAGNOSIS — I10 Essential (primary) hypertension: Secondary | ICD-10-CM | POA: Insufficient documentation

## 2017-10-20 DIAGNOSIS — S20222A Contusion of left back wall of thorax, initial encounter: Secondary | ICD-10-CM | POA: Diagnosis not present

## 2017-10-20 DIAGNOSIS — M25512 Pain in left shoulder: Secondary | ICD-10-CM | POA: Insufficient documentation

## 2017-10-20 DIAGNOSIS — Z85850 Personal history of malignant neoplasm of thyroid: Secondary | ICD-10-CM | POA: Diagnosis not present

## 2017-10-20 DIAGNOSIS — Y92129 Unspecified place in nursing home as the place of occurrence of the external cause: Secondary | ICD-10-CM | POA: Insufficient documentation

## 2017-10-20 DIAGNOSIS — Y999 Unspecified external cause status: Secondary | ICD-10-CM | POA: Diagnosis not present

## 2017-10-20 DIAGNOSIS — Z87891 Personal history of nicotine dependence: Secondary | ICD-10-CM | POA: Insufficient documentation

## 2017-10-20 DIAGNOSIS — S3992XA Unspecified injury of lower back, initial encounter: Secondary | ICD-10-CM | POA: Diagnosis present

## 2017-10-20 LAB — CBG MONITORING, ED: Glucose-Capillary: 109 mg/dL — ABNORMAL HIGH (ref 65–99)

## 2017-10-20 MED ORDER — ACETAMINOPHEN 325 MG PO TABS
650.0000 mg | ORAL_TABLET | Freq: Once | ORAL | Status: AC
  Administered 2017-10-20: 650 mg via ORAL
  Filled 2017-10-20: qty 2

## 2017-10-20 MED ORDER — ACETAMINOPHEN 500 MG PO TABS: 1000.0000 mg | ORAL_TABLET | Freq: Four times a day (QID) | ORAL | 0 refills | Status: AC | PRN

## 2017-10-20 NOTE — ED Provider Notes (Signed)
[facility] EMERGENCY DEPARTMENT Provider Note   CSN: [id number] Arrival date & time: 10/20/17  0753     History   Chief Complaint Chief Complaint  Patient presents with  . Fall    HPI [name] is a 82 y.o. male.  The history is provided by the patient, the nursing home and the EMS personnel.  Fall  This is a new problem. The current episode started 1 to 2 hours ago. The problem occurs constantly. The problem has been resolved. Associated symptoms comments: Pain in the back of the left shoulder.  Facility states pt currently acting at his baseline with hx of dementia.  Takes asa but no other anticoagulation.  Fall was not witness at the facility and pt was found on floor awake.  Pt has no complaints when asked here.. Nothing aggravates the symptoms. Nothing relieves the symptoms. He has tried nothing for the symptoms. The treatment provided no relief.    Past Medical History:  Diagnosis Date  . Anemia   . Hernia   . Hyperlipemia   . Hypertension   . Memory loss   . Obstructive nephropathy   . Prostate CA ([place])   . 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 Thyroid ca [facility]) 2353   Follicular Variant of Papillary CA     Patient Active Problem List   Diagnosis Date Noted  . Peripheral vascular disease, unspecified ([place]) 08/19/2013  . PERSONAL HISTORY OF COLONIC POLYPS 09/13/2010  . HYPERCHOLESTEROLEMIA 09/22/2009  . HYPERTENSION 09/22/2009  . PALPITATIONS 09/03/2009    Past Surgical History:  Procedure Laterality Date  . BACK SURGERY  1986   Lumbar Laminectomy  . CATARACT EXTRACTION Bilateral R eye 03/2013, L eye09/2014   . colon polyps  Nov. 13, 2006  . EYE SURGERY Bilateral    Cataract  . HERNIA REPAIR  6144,  3154   Umbilical and Left inguinal   . JOINT REPLACEMENT    . PROSTATE SURGERY    . PROSTATECTOMY  2004   Radical   . shingles in right eye    . SKIN CANCER EXCISION  Nov. 10, 2011   Left  scalp and Left forearm- SCC  . SPINE SURGERY  1986   Lumbar laminectomy  . THYROID LOBECTOMY Bilateral        Home Medications    Prior to Admission medications   Medication Sig Start Date End Date Taking? Authorizing Provider  aspirin 81 MG tablet Take 81 mg by mouth daily.     Yes [provider]  buPROPion (WELLBUTRIN XL) 150 MG 24 hr tablet Take 150 mg by mouth daily. 10/12/17  Yes [provider]  hydrochlorothiazide (HYDRODIURIL) 25 MG tablet Take 25 mg by mouth daily. 10/12/17  Yes [provider]  levothyroxine (SYNTHROID, LEVOTHROID) 150 MCG tablet Take 150 mcg by mouth daily.    Yes [provider]  lisinopril (PRINIVIL,ZESTRIL) 5 MG tablet Take 5 mg by mouth daily.     Yes [provider]  LORazepam (ATIVAN) 0.5 MG tablet Take 0.5 mg by mouth 3 (three) times daily. 10/19/17  Yes [provider]  LORazepam (ATIVAN) 0.5 MG tablet Take 0.5 mg by mouth every 4 (four) hours as needed for anxiety. May give within 1 hour of scheduled dose. Hold for over sedation   Yes [provider]  NAMENDA XR 28 MG CP24 24 hr capsule TAKE 1 CAPSULE BY MOUTH ONCE DAILY (  DO NOT CRUSH) 04/03/16  Yes [provider]  Cyanocobalamin (VITAMIN B-12 IJ) Inject as directed every 30 (thirty) days.    [provider]  divalproex (DEPAKOTE ER) 500 MG 24 hr tablet Take 1 tablet (500 mg total) by mouth daily. 04/25/16   Garvin Fila, MD  valACYclovir (VALTREX) 1000 MG tablet Take 1,000 mg by mouth 2 (two) times daily.    [provider]    Family History Family History  Problem Relation Age of Onset  . Kidney disease Mother        ESRD  . Cancer Mother   . Heart disease Father   . Stroke Father     Social History Social History   Tobacco Use  . Smoking status: Former Smoker    Last attempt to quit: 08/14/1984    Years since quitting: 33.2  . Smokeless tobacco: Never Used  Substance Use Topics  . Alcohol use: Yes      Alcohol/week: 4.2 - 6.0 oz    Types: 7 - 10 Glasses of wine per week    Comment: everyday  . Drug use: No     Allergies   Patient has no known allergies.   Review of Systems Review of Systems  Unable to perform ROS: Dementia     Physical Exam Updated Vital Signs BP (!) 148/78 (BP Location: Right Arm)   Pulse 87   Temp 98.7 F (37.1 C) (Rectal)   Resp 19   Ht 6' (1.829 m)   Wt 83.9 kg (185 lb)   SpO2 100%   BMI 25.09 kg/m   Physical Exam  Constitutional: He appears well-developed and well-nourished. No distress.  HENT:  Head: Normocephalic and atraumatic.  Mouth/Throat: Oropharynx is clear and moist.  No head trauma present  Eyes: Conjunctivae and EOM are normal. Pupils are equal, round, and reactive to light.  Neck: Normal range of motion. Neck supple. No spinous process tenderness and no muscular tenderness present. Normal range of motion present.  Cardiovascular: Normal rate, regular rhythm and intact distal pulses.  No murmur heard. Pulmonary/Chest: Effort normal and breath sounds normal. No respiratory distress. He has no wheezes. He has no rales.      Abdominal: Soft. He exhibits no distension. There is no tenderness. There is no rebound and no guarding.  Musculoskeletal: Normal range of motion. He exhibits no edema or tenderness.       Right shoulder: Normal.       Left shoulder: Normal.       Right hip: Normal.       Left hip: Normal.  Neurological: He is alert.  Awake and oriented to person  Skin: Skin is warm and dry. No rash noted. No erythema.  Psychiatric: He has a normal mood and affect. His behavior is normal.  Nursing note and vitals reviewed.    ED Treatments / Results  Labs (all labs ordered are listed, but only abnormal results are displayed) Labs Reviewed  CBG MONITORING, ED - Abnormal; Notable for the following components:      Result Value   Glucose-Capillary 109 (*)    All other components within normal limits    EKG  EKG  Interpretation  Date/Time:  Saturday October 20 2017 08:08:49 EST Ventricular Rate:  73 PR Interval:    QRS Duration: 102 QT Interval:  376 QTC Calculation: 415 R Axis:   -36 Text Interpretation:  Sinus rhythm Left axis deviation Artifact No significant change since last tracing Confirmed by Maryan Rued,  Kary Sugrue [phone]) on 10/20/2017 8:30:40 AM       Radiology Dg Chest 2 View  Result Date: 10/20/2017 CLINICAL DATA:  Patient found on floor. EXAM: CHEST - 2 VIEW COMPARISON:  Rib films October 20, 2017 FINDINGS: A PA film could not be obtained due to patient condition. There is a torturous mildly prominent thoracic aorta. However, the findings improved since the rib films from earlier today and I suspect the prominence could be due to tortuosity and technique. The heart, hila, and mediastinum are normal. No pneumothorax. No nodules or masses. No focal infiltrates. IMPRESSION: 1. Unfortunately, a PA film could not be obtained due to patient condition. That being said, the prominent tortuous thoracic aorta is improved since the rib films earlier today and I suspect the abnormality is mostly due to tortuosity and AP technique. If there is concern for aortic pathology, a CT of the chest could be performed. Otherwise, a PA and lateral chest x-ray when the patient is better able to tolerate could be performed. Electronically Signed   By: Dorise Bullion III M.D   On: 10/20/2017 09:24   Dg Ribs Unilateral W/chest Left  Result Date: 10/20/2017 CLINICAL DATA:  Patient found on floor this morning with left shoulder abrasion. EXAM: LEFT RIBS AND CHEST - 3+ VIEW COMPARISON:  Chest CT February 21, 2007 and chest x-ray February 05, 2007 FINDINGS: The thoracic aorta is tortuous and prominent. While the finding could be somewhat due to positioning, aneurysmal dilatation of the aorta is not excluded. No pneumothorax. No pulmonary nodules or masses. No focal infiltrates. No fractures seen on limited views of left shoulder. No rib  fractures are seen. IMPRESSION: 1. Prominence and tortuosity of the thoracic aorta. This was not seen in 2008. The findings could be at least partially due to positioning. However, aneurysmal dilatation is not excluded on this study. Recommend a PA and lateral chest x-ray or chest CT for further evaluation. 2. No rib fractures are noted. Electronically Signed   By: Dorise Bullion III M.D   On: 10/20/2017 08:47    Procedures Procedures (including critical care time)  Medications Ordered in ED Medications  acetaminophen (TYLENOL) tablet 650 mg (650 mg Oral Given 10/20/17 2202)     Initial Impression / Assessment and Plan / ED Course  I have reviewed the triage vital signs and the nursing notes.  Pertinent labs & imaging results that were available during my care of the patient were reviewed by me and considered in my medical decision making (see chart for details).     Patient with most likely a mechanical fall at his nursing facility who was found awake on the floor.  Patient has an abrasion over the left posterior chest wall but is able to use both arms without difficulty and low suspicion for shoulder injury.  He is breathing comfortably but does have pain with sitting up.  He has no midline back tenderness concerning for compression fracture.  He is able to range both legs spontaneously and independently with low suspicion for lower extremity injury.  Vital signs are reassuring. No other evidence of trauma to his head and patient does not take anticoagulation other than aspirin.  We will do a left-sided rib film.  EKG was done prior to evaluation which was unchanged.  9:31 AM Rib films are negative for fracture.  However radiology recommended an AP and lateral film due to concern for tortuous aorta.  This was repeated however a good PA film cannot be obtained  due to patient's condition however they suspect less that there is any issue.  Will discharge patient back to skilled  facility.  Final Clinical Impressions(s) / ED Diagnoses   Final diagnoses:  Fall, initial encounter  Back contusion, left, initial encounter    ED Discharge Orders        Ordered    acetaminophen (TYLENOL) 500 MG tablet  Every 6 hours PRN     10/20/17 2162       Blanchie Dessert, MD 10/20/17 [phone]

## 2017-10-20 NOTE — ED Notes (Signed)
Patient transported to X-ray 

## 2017-10-20 NOTE — ED Triage Notes (Signed)
Per nursing home patient found on floor approximately 0540 this morning. Left shoulder abrasion noted.

## 2017-10-20 NOTE — ED Notes (Signed)
[facility] called to transport back to [facility]

## 2017-11-21 ENCOUNTER — Ambulatory Visit: Payer: Medicare Other | Admitting: Podiatry

## 2017-11-21 DIAGNOSIS — L84 Corns and callosities: Secondary | ICD-10-CM | POA: Diagnosis not present

## 2017-11-21 DIAGNOSIS — M79676 Pain in unspecified toe(s): Secondary | ICD-10-CM | POA: Diagnosis not present

## 2017-11-21 DIAGNOSIS — B351 Tinea unguium: Secondary | ICD-10-CM

## 2017-11-26 NOTE — Progress Notes (Signed)
    Subjective: Patient is a 82 y.o. male presenting to the office today as a new patient with a chief complaint of a painful callus lesion to the left fourth toe that has been present for several months. Applying pressure to the area increases the pain. He has not done anything to treat the symptoms.   Patient also complains of elongated, thickened nails that cause pain while ambulating in shoes. He is unable to trim his own nails. Patient presents today for further treatment and evaluation.  Past Medical History:  Diagnosis Date  . Anemia   . Hernia   . Hyperlipemia   . Hypertension   . Memory loss   . Obstructive nephropathy   . Prostate CA ([place])   . 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 Thyroid ca ([place]) 5916   Follicular Variant of Papillary CA     Objective:  Physical Exam General: Alert and oriented x3 in no acute distress  Dermatology: Hyperkeratotic lesion present on the fourth left toe. Pain on palpation with a central nucleated core noted. Skin is warm, dry and supple bilateral lower extremities. Negative for open lesions or macerations. Nails are tender, long, thickened and dystrophic with subungual debris, consistent with onychomycosis, 1-5 bilateral. No signs of infection noted.  Vascular: Palpable pedal pulses bilaterally. No edema or erythema noted. Capillary refill within normal limits.  Neurological: Epicritic and protective threshold grossly intact bilaterally.   Musculoskeletal Exam: Pain on palpation at the keratotic lesion noted. Hammertoe contracture deformity noted to digits 2-5 of the bilateral feet. Range of motion within normal limits bilateral. Muscle strength 5/5 in all groups bilateral.  Assessment: 1. Onychodystrophic nails 1-5 bilateral with hyperkeratosis of nails.  2. Onychomycosis of nail due to dermatophyte bilateral 3. Pre-ulcerative callus lesion noted to the left fourth toe 4. Hammertoes digits 2-5 bilateral      Plan of Care:  1. Patient evaluated. 2. Excisional debridement of keratoic lesion using a chisel blade was performed without incident.  3. Dressed with light dressing. 4. Mechanical debridement of nails 1-5 bilaterally performed using a nail nipper. Filed with dremel without incident.  5. Patient is to return to the clinic in 3 months.   Edrick Kins, DPM [facility]  Dr. Edrick Kins, [address]                                        [place], [place] [address]                Office [phone]  Fax [phone]

## 2018-02-20 ENCOUNTER — Ambulatory Visit: Payer: Medicare Other | Admitting: Podiatry

## 2018-03-03 ENCOUNTER — Other Ambulatory Visit: Payer: Self-pay

## 2018-03-03 ENCOUNTER — Emergency Department (HOSPITAL_COMMUNITY): Payer: Medicare Other

## 2018-03-03 ENCOUNTER — Emergency Department (HOSPITAL_COMMUNITY)
Admission: EM | Admit: 2018-03-03 | Discharge: 2018-03-03 | Disposition: A | Discharge status: Home / Self Care | Payer: Medicare Other | Attending: Emergency Medicine | Admitting: Emergency Medicine

## 2018-03-03 DIAGNOSIS — Z85828 Personal history of other malignant neoplasm of skin: Secondary | ICD-10-CM | POA: Insufficient documentation

## 2018-03-03 DIAGNOSIS — Z8546 Personal history of malignant neoplasm of prostate: Secondary | ICD-10-CM | POA: Insufficient documentation

## 2018-03-03 DIAGNOSIS — Z87891 Personal history of nicotine dependence: Secondary | ICD-10-CM | POA: Diagnosis not present

## 2018-03-03 DIAGNOSIS — Z79899 Other long term (current) drug therapy: Secondary | ICD-10-CM | POA: Insufficient documentation

## 2018-03-03 DIAGNOSIS — I10 Essential (primary) hypertension: Secondary | ICD-10-CM | POA: Insufficient documentation

## 2018-03-03 DIAGNOSIS — R531 Weakness: Secondary | ICD-10-CM | POA: Diagnosis not present

## 2018-03-03 DIAGNOSIS — Z7982 Long term (current) use of aspirin: Secondary | ICD-10-CM | POA: Insufficient documentation

## 2018-03-03 LAB — BASIC METABOLIC PANEL
Anion gap: 8 (ref 5–15)
BUN: 30 mg/dL — AB (ref 8–23)
CHLORIDE: 109 mmol/L (ref 98–111)
CO2: 25 mmol/L (ref 22–32)
Calcium: 8.4 mg/dL — ABNORMAL LOW (ref 8.9–10.3)
Creatinine, Ser: 1.82 mg/dL — ABNORMAL HIGH (ref 0.61–1.24)
GFR calc Af Amer: 39 mL/min — ABNORMAL LOW (ref >60)
GFR calc non Af Amer: 34 mL/min — ABNORMAL LOW (ref >60)
GLUCOSE: 102 mg/dL — AB (ref 70–99)
POTASSIUM: 4.1 mmol/L (ref 3.5–5.1)
Sodium: 142 mmol/L (ref 135–145)

## 2018-03-03 LAB — CBC WITH DIFFERENTIAL/PLATELET
ABS IMMATURE GRANULOCYTES: 0 10*3/uL (ref 0.0–0.1)
Basophils Absolute: 0.1 10*3/uL (ref 0.0–0.1)
Basophils Relative: 1 %
Eosinophils Absolute: 0.2 10*3/uL (ref 0.0–0.7)
Eosinophils Relative: 4 %
HEMATOCRIT: 34.5 % — AB (ref 39.0–52.0)
HEMOGLOBIN: 10.7 g/dL — AB (ref 13.0–17.0)
Immature Granulocytes: 0 %
LYMPHS ABS: 1.7 10*3/uL (ref 0.7–4.0)
LYMPHS PCT: 29 %
MCH: 31.3 pg (ref 26.0–34.0)
MCHC: 31 g/dL (ref 30.0–36.0)
MCV: 100.9 fL — AB (ref 78.0–100.0)
MONO ABS: 0.6 10*3/uL (ref 0.1–1.0)
MONOS PCT: 10 %
NEUTROS ABS: 3.5 10*3/uL (ref 1.7–7.7)
Neutrophils Relative %: 56 %
Platelets: 262 10*3/uL (ref 150–400)
RBC: 3.42 MIL/uL — ABNORMAL LOW (ref 4.22–5.81)
RDW: 12.8 % (ref 11.5–15.5)
WBC: 6.1 10*3/uL (ref 4.0–10.5)

## 2018-03-03 LAB — URINALYSIS, ROUTINE W REFLEX MICROSCOPIC
Bilirubin Urine: NEGATIVE
Glucose, UA: NEGATIVE mg/dL
Hgb urine dipstick: NEGATIVE
Ketones, ur: NEGATIVE mg/dL
LEUKOCYTES UA: NEGATIVE
NITRITE: NEGATIVE
PH: 5 (ref 5.0–8.0)
Protein, ur: NEGATIVE mg/dL
SPECIFIC GRAVITY, URINE: 1.017 (ref 1.005–1.030)

## 2018-03-03 LAB — I-STAT TROPONIN, ED: Troponin i, poc: 0.01 ng/mL (ref 0.00–0.08)

## 2018-03-03 MED ORDER — SODIUM CHLORIDE 0.9 % IV BOLUS
1000.0000 mL | Freq: Once | INTRAVENOUS | Status: AC
  Administered 2018-03-03: 1000 mL via INTRAVENOUS

## 2018-03-03 NOTE — Discharge Instructions (Signed)
Follow up with your PCP.  Return for any worsening symptoms, chest pain, shortness of breath, fever.  Discuss with your physician your med list and see if you can have certain medications removed from your [facility].

## 2018-03-03 NOTE — ED Notes (Signed)
Patient transported to X-ray 

## 2018-03-03 NOTE — ED Provider Notes (Signed)
[facility] EMERGENCY DEPARTMENT Provider Note   CSN: [id number] Arrival date & time: 03/03/18  1909     History   Chief Complaint Chief Complaint  Patient presents with  . Weakness    HPI [name] is a 82 y.o. male.  82 yo M with a cc of weakness.  Patient picked up to go to church, was noted to be acting funny when wife picked him up.  Now starting to feel really tired, not wanting to ambulate.  911 called.   Denies chest pain, sob, vomiting, diarrhea.    The history is provided by the patient.  Weakness  This is a new problem. The current episode started less than 1 hour ago. The problem has not changed since onset.There has been no fever. Pertinent negatives include no shortness of breath, no chest pain, no vomiting, no confusion and no headaches.  Illness  This is a new problem. The current episode started 3 to 5 hours ago. The problem occurs constantly. The problem has not changed since onset.Pertinent negatives include no chest pain, no abdominal pain, no headaches and no shortness of breath. Nothing aggravates the symptoms. Nothing relieves the symptoms. He has tried nothing for the symptoms. The treatment provided no relief.    Past Medical History:  Diagnosis Date  . Anemia   . Hernia   . Hyperlipemia   . Hypertension   . Memory loss   . Obstructive nephropathy   . Prostate CA ([place])   . 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 Peripheral vascular disease, unspecified ([place]) 08/19/2013  . PERSONAL HISTORY OF COLONIC POLYPS 09/13/2010  . HYPERCHOLESTEROLEMIA 09/22/2009  . HYPERTENSION 09/22/2009  . PALPITATIONS 09/03/2009    Past Surgical History:  Procedure Laterality Date  . BACK SURGERY  1986   Lumbar Laminectomy  . CATARACT EXTRACTION Bilateral R eye 03/2013, L eye09/2014     . colon polyps  Nov. 13, 2006  . EYE SURGERY Bilateral    Cataract  . HERNIA REPAIR  8563,  1497   Umbilical and Left inguinal   . JOINT REPLACEMENT    . PROSTATE SURGERY    . PROSTATECTOMY  2004   Radical   . shingles in right eye    . SKIN CANCER EXCISION  Nov. 10, 2011   Left scalp and Left forearm- SCC  . SPINE SURGERY  1986   Lumbar laminectomy  . THYROID LOBECTOMY Bilateral         Home Medications    Prior to Admission medications   Medication Sig Start Date End Date Taking? Authorizing Provider  aspirin 81 MG tablet Take 81 mg by mouth daily.      [provider]  buPROPion (WELLBUTRIN XL) 150 MG 24 hr tablet Take 150 mg by mouth daily. 10/12/17   [provider]  Cyanocobalamin (VITAMIN B-12 IJ) Inject as directed every 30 (thirty) days.    [provider]  divalproex (DEPAKOTE ER) 500 MG 24 hr tablet Take 1 tablet (500 mg total) by mouth daily. Patient not taking: Reported on 10/20/2017 04/25/16   Garvin Fila, MD  hydrochlorothiazide (HYDRODIURIL) 25 MG tablet Take 25 mg by mouth daily. 10/12/17   [provider]  levothyroxine (SYNTHROID, LEVOTHROID) 150 MCG tablet Take 150 mcg  by mouth daily.     [provider]  lisinopril (PRINIVIL,ZESTRIL) 5 MG tablet Take 5 mg by mouth daily.      [provider]  LORazepam (ATIVAN) 0.5 MG tablet Take 0.5 mg by mouth 3 (three) times daily. 10/19/17   [provider]  LORazepam (ATIVAN) 0.5 MG tablet Take 0.5 mg by mouth every 4 (four) hours as needed for anxiety. May give within 1 hour of scheduled dose. Hold for over sedation    [provider]  NAMENDA XR 28 MG CP24 24 hr capsule TAKE 1 CAPSULE BY MOUTH ONCE DAILY (DO NOT CRUSH) 04/03/16   [provider]    Family History Family History  Problem Relation Age of Onset  . Kidney disease Mother        ESRD  . Cancer Mother   . Heart disease Father   . Stroke Father     Social History Social  History   Tobacco Use  . Smoking status: Former Smoker    Last attempt to quit: 08/14/1984    Years since quitting: 33.5  . Smokeless tobacco: Never Used  Substance Use Topics  . Alcohol use: Yes    Alcohol/week: 4.2 - 6.0 oz    Types: 7 - 10 Glasses of wine per week    Comment: everyday  . Drug use: No     Allergies   Patient has no known allergies.   Review of Systems Review of Systems  Constitutional: Negative for chills and fever.  HENT: Negative for congestion and facial swelling.   Eyes: Negative for discharge and visual disturbance.  Respiratory: Negative for shortness of breath.   Cardiovascular: Negative for chest pain and palpitations.  Gastrointestinal: Negative for abdominal pain, diarrhea and vomiting.  Musculoskeletal: Negative for arthralgias and myalgias.  Skin: Negative for color change and rash.  Neurological: Positive for weakness (generalized). Negative for tremors, syncope and headaches.  Psychiatric/Behavioral: Negative for confusion and dysphoric mood.     Physical Exam Updated Vital Signs BP (!) 180/77 (BP Location: Right Arm)   Pulse 79   Temp 98.6 F (37 C) (Oral)   Resp 18   Ht 5\' 8"  (1.727 m)   Wt 79.4 kg (175 lb)   SpO2 97%   BMI 26.61 kg/m   Physical Exam  Constitutional: He is oriented to person, place, and time. He appears well-developed and well-nourished.  HENT:  Head: Normocephalic and atraumatic.  Eyes: Pupils are equal, round, and reactive to light. EOM are normal.  Neck: Normal range of motion. Neck supple. No JVD present.  Cardiovascular: Normal rate and regular rhythm. Exam reveals no gallop and no friction rub.  No murmur heard. Pulmonary/Chest: No respiratory distress. He has no wheezes.  Abdominal: He exhibits no distension and no mass. There is no tenderness. There is no rebound and no guarding.  Musculoskeletal: Normal range of motion.  Neurological: He is alert and oriented to person, place, and time.  Skin: No  rash noted. No pallor.  Psychiatric: He has a normal mood and affect. His behavior is normal.  Nursing note and vitals reviewed.    ED Treatments / Results  Labs (all labs ordered are listed, but only abnormal results are displayed) Labs Reviewed  CBC WITH DIFFERENTIAL/PLATELET - Abnormal; Notable for the following components:      Result Value   RBC 3.42 (*)    Hemoglobin 10.7 (*)    HCT 34.5 (*)    MCV 100.9 (*)  All other components within normal limits  BASIC METABOLIC PANEL - Abnormal; Notable for the following components:   Glucose, Bld 102 (*)    BUN 30 (*)    Creatinine, Ser 1.82 (*)    Calcium 8.4 (*)    GFR calc non Af Amer 34 (*)    GFR calc Af Amer 39 (*)    All other components within normal limits  URINALYSIS, ROUTINE W REFLEX MICROSCOPIC - Abnormal; Notable for the following components:   APPearance HAZY (*)    All other components within normal limits  I-STAT TROPONIN, ED    EKG EKG Interpretation  Date/Time:  Sunday March 03 2018 19:50:33 EDT Ventricular Rate:  63 PR Interval:  220 QRS Duration: 88 QT Interval:  424 QTC Calculation: 433 R Axis:   -26 Text Interpretation:  Sinus rhythm with 1st degree A-V block Moderate voltage criteria for LVH, may be normal variant Borderline ECG No significant change since last tracing Confirmed by Prashant Glosser ([id number]) on 03/03/2018 7:56:18 PM   Radiology Dg Chest 2 View  Result Date: 03/03/2018 CLINICAL DATA:  Generalized weakness EXAM: CHEST - 2 VIEW COMPARISON:  October 20, 2017 FINDINGS: There is no edema or consolidation. Heart is borderline enlarged with pulmonary vascularity normal. No adenopathy. There is aortic atherosclerosis. No evident bone lesions. IMPRESSION: Borderline cardiac enlargement. No edema or consolidation. There is aortic atherosclerosis. Aortic Atherosclerosis (ICD10-I70.0). Electronically Signed   By: William  Woodruff III M.D.   On: 03/03/2018 20:22    Procedures Procedures (including  critical care time)  Medications Ordered in ED Medications  sodium chloride 0.9 % bolus 1,000 mL (0 mLs Intravenous Stopped 03/03/18 2128)     Initial Impression / Assessment and Plan / ED Course  I have reviewed the triage vital signs and the nursing notes.  Pertinent labs & imaging results that were available during my care of the patient were reviewed by me and considered in my medical decision making (see chart for details).     81  yo M with a cc of of generalized weakness.  Lasted for about two hours.   Occurred after being given ativan. Patient now better.  D/c home.   11:27 PM:  I have discussed the diagnosis/risks/treatment options with the patient and family and believe the pt to be eligible for discharge home to follow-up with PCP. We also discussed returning to the ED immediately if new or worsening sx occur. We discussed the sx which are most concerning (e.g., sudden worsening pain, fever, inability to tolerate by mouth) that necessitate immediate return. Medications administered to the patient during their visit and any new prescriptions provided to the patient are listed below.  Medications given during this visit Medications  sodium chloride 0.9 % bolus 1,000 mL (0 mLs Intravenous Stopped 03/03/18 2128)      The patient appears reasonably screen and/or stabilized for discharge and I doubt any other medical condition or other [facility] requiring further screening, evaluation, or treatment in the ED at this time prior to discharge.    Final Clinical Impressions(s) / ED Diagnoses   Final diagnoses:  Weakness    ED Discharge Orders    None       Deno Etienne, DO 03/03/18 2327

## 2018-03-03 NOTE — ED Triage Notes (Addendum)
Per EMS, pt coming from home with a c/c of generalized weakness ongoing for 2 hours. Pt states he has been weak for the majority of the day. Pt was home but lives at [facility]. Staff administered 0.5 mg of Ativan prior to going home for a visit. No LOC. Negative orthostatic. Negative stroke exam. Pt CAOx3 - not alert to time, per normal.

## 2018-03-06 ENCOUNTER — Ambulatory Visit: Payer: Medicare Other | Admitting: Podiatry

## 2018-03-06 ENCOUNTER — Encounter: Payer: Self-pay | Admitting: Podiatry

## 2018-03-06 DIAGNOSIS — M79676 Pain in unspecified toe(s): Secondary | ICD-10-CM | POA: Diagnosis not present

## 2018-03-06 DIAGNOSIS — B351 Tinea unguium: Secondary | ICD-10-CM | POA: Diagnosis not present

## 2018-03-06 DIAGNOSIS — L989 Disorder of the skin and subcutaneous tissue, unspecified: Secondary | ICD-10-CM

## 2018-03-09 NOTE — Progress Notes (Signed)
    Subjective: Patient is a 82 y.o. male presenting to the office today with a chief complaint of a painful callus lesion to the left fourth toe that has been present for several months. Applying pressure to the area increases the pain. He has not done anything to treat the symptoms at home.   Patient also complains of elongated, thickened nails that cause pain while ambulating in shoes. He is unable to trim his own nails. Patient presents today for further treatment and evaluation.  Past Medical History:  Diagnosis Date  . Anemia   . Hernia   . Hyperlipemia   . Hypertension   . Memory loss   . Obstructive nephropathy   . Prostate CA ([place])   . 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 Thyroid ca ([place]) 2924   Follicular Variant of Papillary CA     Objective:  Physical Exam General: Alert and oriented x3 in no acute distress  Dermatology: Hyperkeratotic lesion present on the fourth left toe. Pain on palpation with a central nucleated core noted. Skin is warm, dry and supple bilateral lower extremities. Negative for open lesions or macerations. Nails are tender, long, thickened and dystrophic with subungual debris, consistent with onychomycosis, 1-5 bilateral. No signs of infection noted.  Vascular: Palpable pedal pulses bilaterally. No edema or erythema noted. Capillary refill within normal limits.  Neurological: Epicritic and protective threshold grossly intact bilaterally.   Musculoskeletal Exam: Pain on palpation at the keratotic lesion noted. Hammertoe contracture deformity noted to digits 2-5 of the bilateral feet. Range of motion within normal limits bilateral. Muscle strength 5/5 in all groups bilateral.  Assessment: 1. Onychodystrophic nails 1-5 bilateral with hyperkeratosis of nails.  2. Onychomycosis of nail due to dermatophyte bilateral 3. Corn 4th left toe   Plan of Care:  1. Patient evaluated. 2. Excisional debridement of keratoic  lesion using a chisel blade was performed without incident.  3. Dressed with light dressing. 4. Mechanical debridement of nails 1-5 bilaterally performed using a nail nipper. Filed with dremel without incident.  5. Patient is to return to the clinic in 3 months.   Edrick Kins, DPM [facility]  Dr. Edrick Kins, [address]                                        [place], [place] [address]                Office [phone]  Fax [phone]

## 2018-06-05 DIAGNOSIS — N3946 Mixed incontinence: Secondary | ICD-10-CM | POA: Insufficient documentation

## 2018-06-06 ENCOUNTER — Other Ambulatory Visit: Payer: Self-pay

## 2018-06-06 ENCOUNTER — Ambulatory Visit: Payer: Medicare Other | Admitting: Podiatry

## 2018-06-06 ENCOUNTER — Encounter: Payer: Self-pay | Admitting: Podiatry

## 2018-06-06 DIAGNOSIS — B351 Tinea unguium: Secondary | ICD-10-CM

## 2018-06-06 DIAGNOSIS — M79675 Pain in left toe(s): Secondary | ICD-10-CM | POA: Diagnosis not present

## 2018-06-06 DIAGNOSIS — M79674 Pain in right toe(s): Secondary | ICD-10-CM

## 2018-06-06 DIAGNOSIS — L84 Corns and callosities: Secondary | ICD-10-CM

## 2018-06-06 DIAGNOSIS — B0233 Zoster keratitis: Secondary | ICD-10-CM | POA: Insufficient documentation

## 2018-06-18 ENCOUNTER — Emergency Department (HOSPITAL_COMMUNITY)
Admission: EM | Admit: 2018-06-18 | Discharge: 2018-06-18 | Disposition: A | Discharge status: Home / Self Care | Payer: Medicare Other | Attending: Emergency Medicine | Admitting: Emergency Medicine

## 2018-06-18 ENCOUNTER — Emergency Department (HOSPITAL_COMMUNITY): Payer: Medicare Other

## 2018-06-18 ENCOUNTER — Encounter (HOSPITAL_COMMUNITY): Payer: Self-pay

## 2018-06-18 DIAGNOSIS — Z85828 Personal history of other malignant neoplasm of skin: Secondary | ICD-10-CM | POA: Insufficient documentation

## 2018-06-18 DIAGNOSIS — I10 Essential (primary) hypertension: Secondary | ICD-10-CM | POA: Diagnosis not present

## 2018-06-18 DIAGNOSIS — Z79899 Other long term (current) drug therapy: Secondary | ICD-10-CM | POA: Insufficient documentation

## 2018-06-18 DIAGNOSIS — R1013 Epigastric pain: Secondary | ICD-10-CM | POA: Insufficient documentation

## 2018-06-18 DIAGNOSIS — Z8546 Personal history of malignant neoplasm of prostate: Secondary | ICD-10-CM | POA: Diagnosis not present

## 2018-06-18 DIAGNOSIS — F0390 Unspecified dementia without behavioral disturbance: Secondary | ICD-10-CM | POA: Insufficient documentation

## 2018-06-18 DIAGNOSIS — R1011 Right upper quadrant pain: Secondary | ICD-10-CM | POA: Diagnosis present

## 2018-06-18 DIAGNOSIS — Z87891 Personal history of nicotine dependence: Secondary | ICD-10-CM | POA: Insufficient documentation

## 2018-06-18 DIAGNOSIS — Z7982 Long term (current) use of aspirin: Secondary | ICD-10-CM | POA: Insufficient documentation

## 2018-06-18 LAB — CBC WITH DIFFERENTIAL/PLATELET
Abs Immature Granulocytes: 0.02 10*3/uL (ref 0.00–0.07)
Basophils Absolute: 0.1 10*3/uL (ref 0.0–0.1)
Basophils Relative: 1 %
Eosinophils Absolute: 0.3 10*3/uL (ref 0.0–0.5)
Eosinophils Relative: 5 %
HCT: 38.5 % — ABNORMAL LOW (ref 39.0–52.0)
Hemoglobin: 12.2 g/dL — ABNORMAL LOW (ref 13.0–17.0)
Immature Granulocytes: 0 %
Lymphocytes Relative: 28 %
Lymphs Abs: 1.9 10*3/uL (ref 0.7–4.0)
MCH: 30.5 pg (ref 26.0–34.0)
MCHC: 31.7 g/dL (ref 30.0–36.0)
MCV: 96.3 fL (ref 80.0–100.0)
Monocytes Absolute: 0.6 10*3/uL (ref 0.1–1.0)
Monocytes Relative: 9 %
Neutro Abs: 3.8 10*3/uL (ref 1.7–7.7)
Neutrophils Relative %: 57 %
Platelets: 263 10*3/uL (ref 150–400)
RBC: 4 MIL/uL — ABNORMAL LOW (ref 4.22–5.81)
RDW: 13.6 % (ref 11.5–15.5)
WBC: 6.7 10*3/uL (ref 4.0–10.5)
nRBC: 0 % (ref 0.0–0.2)

## 2018-06-18 LAB — COMPREHENSIVE METABOLIC PANEL
ALT: 8 U/L (ref 0–44)
AST: 13 U/L — ABNORMAL LOW (ref 15–41)
Albumin: 3.6 g/dL (ref 3.5–5.0)
Alkaline Phosphatase: 75 U/L (ref 38–126)
Anion gap: 7 (ref 5–15)
BUN: 35 mg/dL — ABNORMAL HIGH (ref 8–23)
CO2: 27 mmol/L (ref 22–32)
Calcium: 8.5 mg/dL — ABNORMAL LOW (ref 8.9–10.3)
Chloride: 107 mmol/L (ref 98–111)
Creatinine, Ser: 1.79 mg/dL — ABNORMAL HIGH (ref 0.61–1.24)
GFR calc Af Amer: 40 mL/min — ABNORMAL LOW (ref >60)
GFR calc non Af Amer: 34 mL/min — ABNORMAL LOW (ref >60)
Glucose, Bld: 109 mg/dL — ABNORMAL HIGH (ref 70–99)
Potassium: 4 mmol/L (ref 3.5–5.1)
Sodium: 141 mmol/L (ref 135–145)
Total Bilirubin: 0.7 mg/dL (ref 0.3–1.2)
Total Protein: 7 g/dL (ref 6.5–8.1)

## 2018-06-18 LAB — LIPASE, BLOOD: Lipase: 45 U/L (ref 11–51)

## 2018-06-18 MED ORDER — ALPRAZOLAM 0.5 MG PO TABS
1.0000 mg | ORAL_TABLET | Freq: Once | ORAL | Status: AC
  Administered 2018-06-18: 1 mg via ORAL
  Filled 2018-06-18: qty 2

## 2018-06-18 MED ORDER — LORAZEPAM 2 MG/ML IJ SOLN: 1.0000 mg | Freq: Once | INTRAMUSCULAR | Status: DC

## 2018-06-18 MED ORDER — IOPAMIDOL (ISOVUE-300) INJECTION 61%
INTRAVENOUS | Status: AC
  Filled 2018-06-18: qty 100

## 2018-06-18 MED ORDER — SODIUM CHLORIDE 0.9 % IJ SOLN
INTRAMUSCULAR | Status: AC
  Filled 2018-06-18: qty 50

## 2018-06-18 MED ORDER — IOPAMIDOL (ISOVUE-300) INJECTION 61%
100.0000 mL | Freq: Once | INTRAVENOUS | Status: AC | PRN
  Administered 2018-06-18: 80 mL via INTRAVENOUS

## 2018-06-18 NOTE — ED Notes (Signed)
Patient is agitated and threatening employees at this time. Pt refused CT staff for the scan. MD notified.

## 2018-06-18 NOTE — ED Notes (Signed)
Pt is trying to remove IV at this time. IV has been wrapped in coban. Wife states that he occasionally becomes aggressive with his dementia.

## 2018-06-18 NOTE — ED Notes (Signed)
Ed provider at bedside

## 2018-06-18 NOTE — ED Notes (Signed)
Pt refused to take vital signs before discharge.

## 2018-06-18 NOTE — ED Notes (Signed)
Patient aware we need urine. Unable to provide specimen at this time

## 2018-06-18 NOTE — ED Notes (Signed)
Bed: WS39 Expected date:  Expected time:  Means of arrival:  Comments: 82 yo up abd pain x48hr rm 12

## 2018-06-18 NOTE — ED Triage Notes (Signed)
Patient arrived via [facility] from nursing facility ([facility] Patient c/o right upper quad. Pain X2days. Denies n/v and diarrhea. No rebound tenderness no palpable masses.  Hx. Dementia. Patient able to ambulate for short distance.   Wife is on the way.

## 2018-06-18 NOTE — ED Provider Notes (Signed)
[facility] DEPT Provider Note   CSN: [id number] Arrival date & time: 06/18/18  1631     History   Chief Complaint Chief Complaint  Patient presents with  . Abdominal Pain    HPI OSRIC KLOPF is a 82 y.o. male.  HPI   82 year old male with abdominal pain.  He has advanced dementia and history is limited.  Wife is now at bedside.  She reports that being complaining of upper abdominal pain about 2 days ago.  Since then he is stated several times that he is having pain.  He has been pointing to his right upper quadrant.  No fevers that she is aware of.  No vomiting or diarrhea.  She feels like he looks distended.  Surgical history is significant for umbilical and inguinal hernia repairs.  Past Medical History:  Diagnosis Date  . Anemia   . Hernia   . Hyperlipemia   . Hypertension   . Memory loss   . Obstructive nephropathy   . Prostate CA ([place])   . 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 Thyroid ca [facility]) 3903   Follicular Variant of Papillary CA     Patient Active Problem List   Diagnosis Date Noted  . 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 Mixed incontinence 06/05/2018  . Dementia ([place]) 01/13/2015  . Macrocytosis 10/30/2013  . Peripheral vascular disease, unspecified ([place]) 08/19/2013  . Pseudophakia of both eyes 04/02/2013  . Myopia 09/11/2012  . Malignant neoplasm of prostate ([place]) 04/17/2012  . Corneal scar 01/17/2012  . Osteopenia 09/07/2011  . Central opacity of cornea 08/16/2011  . Anemia 05/15/2011  . Cardiac arrhythmia 05/15/2011  . Dyslipidemia 05/15/2011  . Renal insufficiency syndrome 05/15/2011  . PERSONAL HISTORY OF COLONIC POLYPS 09/13/2010  . HYPERCHOLESTEROLEMIA 09/22/2009  . HYPERTENSION 09/22/2009  . PALPITATIONS 09/03/2009    Past Surgical History:  Procedure Laterality Date  . BACK SURGERY  1986   Lumbar Laminectomy  . CATARACT EXTRACTION Bilateral R eye  03/2013, L eye09/2014   . colon polyps  Nov. 13, 2006  . EYE SURGERY Bilateral    Cataract  . HERNIA REPAIR  0092,  3300   Umbilical and Left inguinal   . JOINT REPLACEMENT    . PROSTATE SURGERY    . PROSTATECTOMY  2004   Radical   . shingles in right eye    . SKIN CANCER EXCISION  Nov. 10, 2011   Left scalp and Left forearm- SCC  . SPINE SURGERY  1986   Lumbar laminectomy  . THYROID LOBECTOMY Bilateral         Home Medications    Prior to Admission medications   Medication Sig Start Date End Date Taking? Authorizing Provider  acetaminophen (TYLENOL) 500 MG tablet Take by mouth.    [provider]  ALPRAZolam Duanne Moron) 0.25 MG tablet  03/30/18   [provider]  aspirin 81 MG tablet Take 81 mg by mouth daily.      [provider]  Cyanocobalamin (VITAMIN B-12 IJ) Inject as directed every 30 (thirty) days.    [provider]  divalproex (DEPAKOTE ER) 500 MG 24 hr tablet Take 1 tablet (500 mg total) by mouth daily. Patient not taking: Reported on 03/06/2018 04/25/16   Garvin Fila, MD  donepezil (ARICEPT) 10 MG tablet  02/06/18   [provider]  ENSURE (ENSURE) Take 237 mLs by mouth 3 (three) times daily between  meals.    [provider]  hydrochlorothiazide (HYDRODIURIL) 25 MG tablet Take 25 mg by mouth daily. 10/12/17   [provider]  levothyroxine (SYNTHROID, LEVOTHROID) 137 MCG tablet  05/29/18   [provider]  levothyroxine (SYNTHROID, LEVOTHROID) 150 MCG tablet Take 150 mcg by mouth daily.     [provider]  lisinopril (PRINIVIL,ZESTRIL) 5 MG tablet Take 5 mg by mouth daily.      [provider]  LORazepam (ATIVAN) 0.5 MG tablet Take 0.5 mg by mouth 3 (three) times daily. 10/19/17   [provider]  LORazepam (ATIVAN) 0.5 MG tablet Take 0.5 mg by mouth every 4 (four) hours as needed for anxiety. May give within 1 hour of scheduled dose. Hold for over sedation    [provider]  metoprolol tartrate (LOPRESSOR) 25 MG tablet  05/29/18   [provider]  mupirocin ointment (BACTROBAN) 2 %  04/17/18   [provider]  NAMENDA XR 28 MG CP24 24 hr capsule TAKE 1 CAPSULE BY MOUTH ONCE DAILY (DO NOT CRUSH) 04/03/16   [provider]  sertraline (ZOLOFT) 50 MG tablet  02/06/18   [provider]  triamterene-hydrochlorothiazide (MAXZIDE) 75-50 MG tablet  01/09/18   [provider]    Family History Family History  Problem Relation Age of Onset  . Kidney disease Mother        ESRD  . Cancer Mother   . Heart disease Father   . Stroke Father     Social History Social History   Tobacco Use  . Smoking status: Former Smoker    Last attempt to quit: 08/14/1984    Years since quitting: 33.8  . Smokeless tobacco: Never Used  Substance Use Topics  . Alcohol use: Yes    Alcohol/week: 7.0 - 10.0 standard drinks    Types: 7 - 10 Glasses of wine per week    Comment: everyday  . Drug use: No     Allergies   Patient has no known allergies.   Review of Systems Review of Systems  Level 5 caveat because of advanced dementia.   Physical Exam Updated Vital Signs Temp 97.8 F (36.6 C) (Oral)   Resp 17   SpO2 97%   Physical Exam  Constitutional: He appears well-developed and well-nourished. No distress.  Laying in bed.  No acute distress.  HENT:  Head: Normocephalic and atraumatic.  Eyes: Conjunctivae are normal. Right eye exhibits no discharge. Left eye exhibits no discharge.  Neck: Neck supple.  Cardiovascular: Normal rate, regular rhythm and normal heart sounds. Exam reveals no gallop and no friction rub.  No murmur heard. Pulmonary/Chest: Effort normal and breath sounds normal. No respiratory distress.  Abdominal: Soft. There is tenderness.  Mild epigastric tenderness without rebound or guarding.  Musculoskeletal: He exhibits no edema or tenderness.  Neurological: He is alert.  Skin: Skin is warm and  dry.  Psychiatric: He has a normal mood and affect. His behavior is normal. Thought content normal.  Nursing note and vitals reviewed.    ED Treatments / Results  Labs (all labs ordered are listed, but only abnormal results are displayed) Labs Reviewed  CBC WITH DIFFERENTIAL/PLATELET - Abnormal; Notable for the following components:      Result Value   RBC 4.00 (*)    Hemoglobin 12.2 (*)    HCT 38.5 (*)    All other components within normal limits  COMPREHENSIVE METABOLIC PANEL - Abnormal; Notable for the following components:  Glucose, Bld 109 (*)    BUN 35 (*)    Creatinine, Ser 1.79 (*)    Calcium 8.5 (*)    AST 13 (*)    GFR calc non Af Amer 34 (*)    GFR calc Af Amer 40 (*)    All other components within normal limits  LIPASE, BLOOD    EKG None  Radiology No results found.  Ct Abdomen Pelvis W Contrast  Result Date: 06/18/2018 CLINICAL DATA:  Abdominal pain EXAM: CT ABDOMEN AND PELVIS WITH CONTRAST TECHNIQUE: Multidetector CT imaging of the abdomen and pelvis was performed using the standard protocol following bolus administration of intravenous contrast. CONTRAST:  66mL ISOVUE-300 IOPAMIDOL (ISOVUE-300) INJECTION 61% COMPARISON:  CT pelvis 01/12/2004 FINDINGS: LOWER CHEST: There is no basilar pleural or apical pericardial effusion. HEPATOBILIARY: The hepatic contours and density are normal. There is no intra- or extrahepatic biliary dilatation. The gallbladder is normal. PANCREAS: The pancreatic parenchymal contours are normal and there is no ductal dilatation. There is no peripancreatic fluid collection. SPLEEN: Normal. ADRENALS/URINARY TRACT: --Adrenal glands: Normal. --Right kidney/ureter: Cortical atrophy with multiple vascular calcifications. --Left kidney/ureter: Cortical atrophy with vascular calcifications. --Urinary bladder: Normal for degree of distention STOMACH/BOWEL: --Stomach/Duodenum: There is no hiatal hernia or other gastric abnormality. The duodenal course  and caliber are normal. --Small bowel: No dilatation or inflammation. --Colon: Rectosigmoid diverticulosis without acute inflammation. --Appendix: Normal. VASCULAR/LYMPHATIC: Atherosclerotic calcification is present within the non-aneurysmal abdominal aorta, without hemodynamically significant stenosis. The portal vein, splenic vein, superior mesenteric vein and IVC are patent. No abdominal or pelvic lymphadenopathy. REPRODUCTIVE: Status post prostatectomy. MUSCULOSKELETAL. No bony spinal canal stenosis or focal osseous abnormality. OTHER: None. IMPRESSION: 1. No acute abnormality of the abdomen or pelvis. 2.  Aortic atherosclerosis (ICD10-I70.0). 3. Renal atrophy, possibly indicating chronic medical renal disease. Electronically Signed   By: Ulyses Jarred M.D.   On: 06/18/2018 22:19   US Abdomen Limited  Result Date: 06/18/2018 CLINICAL DATA:  Right upper quadrant abdomen pain. EXAM: ULTRASOUND ABDOMEN LIMITED RIGHT UPPER QUADRANT COMPARISON:  None. FINDINGS: Gallbladder: No gallstones or wall thickening visualized. No sonographic Murphy sign noted by sonographer. Common bile duct: Diameter: 3.9 mm. Liver: No focal lesion identified. Diffuse increased echotexture of liver is noted. Portal vein is patent on color Doppler imaging with normal direction of blood flow towards the liver. IMPRESSION: Normal gallbladder. Diffuse increased echotexture of the liver, nonspecific but can be seen in fatty infiltration of liver. Electronically Signed   By: Abelardo Diesel M.D.   On: 06/18/2018 20:49    Procedures Procedures (including critical care time)  Medications Ordered in ED Medications - No data to display   Initial Impression / Assessment and Plan / ED Course  I have reviewed the triage vital signs and the nursing notes.  Pertinent labs & imaging results that were available during my care of the patient were reviewed by me and considered in my medical decision making (see chart for details).  82 year old  male with abdominal pain.  Hard to get a clear history because of his dementia.  His wife is not at bedside reports that he appears to be at his baseline.  His ED work-up is been reassuring.  Unclear etiology, but I doubt emergent process.  Emergent return precautions were discussed with his wife.   Final Clinical Impressions(s) / ED Diagnoses   Final diagnoses:  Epigastric pain    ED Discharge Orders    None       Virgel Manifold, MD 06/27/18  1219  

## 2018-06-18 NOTE — ED Notes (Signed)
Pt's wife is taking pt back to [facility] facility.

## 2018-06-18 NOTE — ED Notes (Signed)
Wife at bedside requesting to speak to MD. MD made aware.

## 2018-06-25 ENCOUNTER — Encounter: Payer: Self-pay | Admitting: Podiatry

## 2018-06-25 NOTE — Progress Notes (Signed)
Subjective: [name] presents today accompanied by his wife  for follow up of chronic painful, discolored, thick toenails of both feet which interfere with daily activities and routine tasks.  Pain is aggravated when wearing enclosed shoe gear. Pain is relieved with periodic professional debridement.  [name] also has a painful corn on his left 4th digit which is relieved with periodic professional debridement.  He resides in [place] at  [facility] at [facility] which is their Memory Care Unit.  Objective: Vascular Examination: Capillary refill time immediate x 10 digits Dorsalis pedis and posterior tibial pulses present b/l No digital hair x 10 digits Skin temperature warm to warm b/l  Dermatological Examination: Skin with normal turgor, texture and tone b/l Toenails 1-5 b/l discolored, thick, dystrophic with subungual debris and pain with palpation to nailbeds due to thickness of nails. Hyperkeratotic lesion noted left 4th digit with no erythema, no edema, no drainage No interdigital macerations noted No open wounds noted b/l  Musculoskeletal: Muscle strength 5/5 to all LE muscle groups  Neurological: Sensation intact with 10 gram monofilament. Vibratory sensation intact.  Assessment: 1. Painful onychomycosis toenails 1-5 b/l  2. Corn left 4th digit  Plan: 1. Toenails 1-5 b/l were debrided in length and girth without iatrogenic bleeding. 2. Hyperkeratotic lesion pared without complication 3. Patient to continue soft, supportive shoe gear 4. Patient to report any pedal injuries to medical professional immediately. 5. Follow up 3 months. Patient/POA to call should there be a concern in the interim.

## 2018-07-24 ENCOUNTER — Telehealth: Payer: Self-pay

## 2018-07-24 NOTE — Telephone Encounter (Signed)
Received verbal order for Palliative Care referral from Claud Kelp NP

## 2018-07-25 ENCOUNTER — Encounter: Payer: Self-pay | Admitting: Internal Medicine

## 2018-07-25 ENCOUNTER — Non-Acute Institutional Stay: Payer: Medicare Other | Admitting: Internal Medicine

## 2018-07-25 VITALS — BP 170/80 | HR 64 | Resp 12 | Wt 165.0 lb

## 2018-07-25 DIAGNOSIS — F0391 Unspecified dementia with behavioral disturbance: Secondary | ICD-10-CM
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
## 2018-07-26 ENCOUNTER — Telehealth: Payer: Self-pay | Admitting: Internal Medicine

## 2018-07-26 NOTE — Telephone Encounter (Signed)
07/26/2018,  2:45pm  Left VM for [facility] Tree surgeon for [place] at [facility]). Asked if it would be possible for nursing staff to document patient's episodes of agitation as regards to time of onset, precipitating factors (if able to identify), patient response to interventions (redirection, ignoring behavior, medication management) and time of duration of episode. I left Charity my contact information.  Violeta Gelinas NP-C

## 2018-07-30 ENCOUNTER — Telehealth: Payer: Self-pay | Admitting: Internal Medicine
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
## 2018-07-30 NOTE — Telephone Encounter (Signed)
11:15am:  Return TC from Earle Gell, NP with Dr. Baldwin Crown office. Ria Comment will send order for Depakote sprinkles 125mg  tid to patient's facility pharmacy [facility] [address] [place], [place] [address], [phone]). I spoke to [place] at the facility; she will f/u with pharmacy and have them fax a duplicate order to place on facility chart. Violeta Gelinas [phone]

## 2018-08-08 ENCOUNTER — Telehealth: Payer: Self-pay | Admitting: Internal Medicine

## 2018-08-08 NOTE — Telephone Encounter (Signed)
9AM: Returned TC from patient's PCG wife [name]. [name] is concerned regarding patient's gradual progressive LE swelling and increase abdominal girth. Current weight of 196 lbs, up 31 lbs since his admission to [facility] 10 months ago. LE swelling such that she can no longer apply patient's shoes. She mentions patient has h/o renal insufficiency with past creatinine level as high as 2.7. [name] also reports patient with progression in cognitive decline over these past 2 weeks, such that he need increased cueing with simple tasks, and is now incontinent of bowel and bladder.  We discussed how much investigative medical work-up for underlying etiologies [name] would wish to pursue, given patient's progressive dementia. [name] was tearful. She acknowledged she wouldn't wish to be aggressive but would like to symptomatically "treat the treatable". With that in mind, I recommended [name] call the office of her PCG to be seen today or tomorrow. If, for instance, lab work suggested renal dysfunction as an etiology, Zigmund Daniel would not wish to pursue any further w/u but would be reassured she wasn't missing an obvious problem. If she can't get an appointment today, I'll stop by the facility to see patient (though facility cannot draw blood work).  Violeta Gelinas NP-C [phone]

## 2018-08-08 NOTE — Telephone Encounter (Signed)
12:15 pm: TC to PCG [employer]. I shared with her that Claud Kelp has scheduled patient to be see today at 3 pm. Ulis Rias confirmed that she will have patient there at this time.  Violeta Gelinas NP-C [phone]

## 2018-08-11 ENCOUNTER — Emergency Department (HOSPITAL_COMMUNITY): Payer: Medicare Other

## 2018-08-11 ENCOUNTER — Emergency Department (HOSPITAL_COMMUNITY)
Admission: EM | Admit: 2018-08-11 | Discharge: 2018-08-11 | Disposition: A | Discharge status: Home / Self Care | Payer: Medicare Other | Attending: Emergency Medicine | Admitting: Emergency Medicine

## 2018-08-11 DIAGNOSIS — Y939 Activity, unspecified: Secondary | ICD-10-CM | POA: Diagnosis not present

## 2018-08-11 DIAGNOSIS — Z85850 Personal history of malignant neoplasm of thyroid: Secondary | ICD-10-CM | POA: Diagnosis not present

## 2018-08-11 DIAGNOSIS — S0081XA Abrasion of other part of head, initial encounter: Secondary | ICD-10-CM | POA: Diagnosis not present

## 2018-08-11 DIAGNOSIS — I10 Essential (primary) hypertension: Secondary | ICD-10-CM | POA: Insufficient documentation

## 2018-08-11 DIAGNOSIS — W19XXXA Unspecified fall, initial encounter: Secondary | ICD-10-CM | POA: Diagnosis not present

## 2018-08-11 DIAGNOSIS — Z85828 Personal history of other malignant neoplasm of skin: Secondary | ICD-10-CM | POA: Diagnosis not present

## 2018-08-11 DIAGNOSIS — Z87891 Personal history of nicotine dependence: Secondary | ICD-10-CM | POA: Insufficient documentation

## 2018-08-11 DIAGNOSIS — Z7982 Long term (current) use of aspirin: Secondary | ICD-10-CM | POA: Insufficient documentation

## 2018-08-11 DIAGNOSIS — Y999 Unspecified external cause status: Secondary | ICD-10-CM | POA: Diagnosis not present

## 2018-08-11 DIAGNOSIS — Z79899 Other long term (current) drug therapy: Secondary | ICD-10-CM | POA: Insufficient documentation

## 2018-08-11 DIAGNOSIS — Y92129 Unspecified place in nursing home as the place of occurrence of the external cause: Secondary | ICD-10-CM | POA: Diagnosis not present

## 2018-08-11 DIAGNOSIS — F0390 Unspecified dementia without behavioral disturbance: Secondary | ICD-10-CM | POA: Diagnosis not present

## 2018-08-11 DIAGNOSIS — R140 Abdominal distension (gaseous): Secondary | ICD-10-CM | POA: Insufficient documentation

## 2018-08-11 DIAGNOSIS — S0990XA Unspecified injury of head, initial encounter: Secondary | ICD-10-CM | POA: Diagnosis present

## 2018-08-11 LAB — URINALYSIS, ROUTINE W REFLEX MICROSCOPIC
BILIRUBIN URINE: NEGATIVE
Glucose, UA: NEGATIVE mg/dL
HGB URINE DIPSTICK: NEGATIVE
Ketones, ur: NEGATIVE mg/dL
Leukocytes, UA: NEGATIVE
NITRITE: NEGATIVE
Protein, ur: NEGATIVE mg/dL
SPECIFIC GRAVITY, URINE: 1.015 (ref 1.005–1.030)
pH: 6 (ref 5.0–8.0)
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
## 2018-08-11 NOTE — ED Triage Notes (Signed)
Transported by [facility] from [facility]-- unwitnessed fall. +hematoma to left side of forehead. Hx of dementia, AAO x 1 per baseline. Patient denies injury and denies falling. +COMBATIVE

## 2018-08-11 NOTE — ED Provider Notes (Signed)
[facility] DEPT Provider Note   CSN: [id number] Arrival date & time: 08/11/18  1012     History   Chief Complaint Chief Complaint  Patient presents with  . Fall    HPI [name] is a 82 y.o. male.  HPI   82 year old male with a history of dementia, hypertension, hyperlipidemia presents with concern for fall at the facility. Per facility he had unwitnessed fall and hit forehead. Otherwise at baseline, no other significant abnormalities or changes.  Wife at bedside reports he is otherwise at baseline, although he has had a decline over the last 3 weeks for which they have seen his regular doctor.  Reports they have been monitoring his renal function which has somewhat worsened and while he has some leg swelling they did not want to increase his lasix. Reports he has been more combative at times.  No focal numbness, weakness.  Has not reported headache, chest pain, fevers or cough.  Reports he was found this morning around 9:15 AM.   Past Medical History:  Diagnosis Date  . Anemia   . Hernia   . Hyperlipemia   . Hypertension   . Memory loss   . Obstructive nephropathy   . Prostate CA ([place])   . 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 Mixed incontinence 06/05/2018  . Dementia ([place]) 01/13/2015  . Macrocytosis 10/30/2013  . Peripheral vascular disease, unspecified ([place]) 08/19/2013  . Pseudophakia of both eyes 04/02/2013  . Myopia 09/11/2012  . Malignant neoplasm of prostate ([place]) 04/17/2012  . Corneal scar 01/17/2012  . Osteopenia 09/07/2011  . Central opacity of cornea 08/16/2011  . Anemia 05/15/2011  . Cardiac arrhythmia 05/15/2011  . Dyslipidemia 05/15/2011  . Renal insufficiency syndrome 05/15/2011  . PERSONAL HISTORY OF COLONIC  POLYPS 09/13/2010  . HYPERCHOLESTEROLEMIA 09/22/2009  . HYPERTENSION 09/22/2009  . PALPITATIONS 09/03/2009    Past Surgical History:  Procedure Laterality Date  . BACK SURGERY  1986   Lumbar Laminectomy  . CATARACT EXTRACTION Bilateral R eye 03/2013, L eye09/2014   . colon polyps  Nov. 13, 2006  . EYE SURGERY Bilateral    Cataract  . HERNIA REPAIR  7408,  1448   Umbilical and Left inguinal   . JOINT REPLACEMENT    . PROSTATE SURGERY    . PROSTATECTOMY  2004   Radical   . shingles in right eye    . SKIN CANCER EXCISION  Nov. 10, 2011   Left scalp and Left forearm- SCC  . SPINE SURGERY  1986   Lumbar laminectomy  . THYROID LOBECTOMY Bilateral         Home Medications    Prior to Admission medications   Medication Sig Start Date End Date Taking? Authorizing Provider  acetaminophen (TYLENOL) 500 MG tablet Take 1,000 mg by mouth 2 (two) times daily as needed (pain).    Yes [provider]  ALPRAZolam (XANAX) 0.25 MG tablet Take 0.25 mg by mouth daily as needed for anxiety (agitation). Once a day prn agitation 03/30/18  Yes [provider]  aspirin 81 MG tablet Take 81 mg by mouth daily.     Yes [provider]  divalproex (DEPAKOTE SPRINKLE) 125 MG capsule Take 125 mg by mouth 3 (  three) times daily.   Yes Geoffery Lyons, NP  levothyroxine (SYNTHROID, LEVOTHROID) 137 MCG tablet  05/29/18  Yes [provider]  metoprolol tartrate (LOPRESSOR) 25 MG tablet Take 25 mg by mouth daily.  05/29/18  Yes [provider]  polyethylene glycol (MIRALAX / GLYCOLAX) packet Take 17 g by mouth every other day. And as needed for constipation   Yes [provider]  sertraline (ZOLOFT) 50 MG tablet  02/06/18  Yes [provider]    Family History Family History  Problem Relation Age of Onset  . Kidney disease Mother        ESRD  . Cancer Mother   . Heart disease Father   . Stroke Father     Social History Social History    Tobacco Use  . Smoking status: Former Smoker    Last attempt to quit: 08/14/1984    Years since quitting: 34.0  . Smokeless tobacco: Never Used  Substance Use Topics  . Alcohol use: Yes    Alcohol/week: 7.0 - 10.0 standard drinks    Types: 7 - 10 Glasses of wine per week    Comment: everyday  . Drug use: No     Allergies   Patient has no known allergies.   Review of Systems Review of Systems  Unable to perform ROS: Dementia     Physical Exam Updated Vital Signs BP (!) 179/77   Pulse (!) 50   Temp 98.1 F (36.7 C) (Oral)   Resp 16   SpO2 99%   Physical Exam Vitals signs and nursing note reviewed.  Constitutional:      General: He is not in acute distress.    Appearance: He is well-developed. He is not diaphoretic.     Comments: Sitting in no distress with eyes closed, will open them when asked. Follows some commands but otherwise states "leave me alone"   HENT:     Head: Normocephalic.     Comments: Abrasion left forehead Eyes:     Conjunctiva/sclera: Conjunctivae normal.  Neck:     Musculoskeletal: Normal range of motion.  Cardiovascular:     Rate and Rhythm: Normal rate and regular rhythm.     Heart sounds: Normal heart sounds. No murmur. No friction rub. No gallop.   Pulmonary:     Effort: Pulmonary effort is normal. No respiratory distress.     Breath sounds: Normal breath sounds. No wheezing or rales.  Abdominal:     General: There is distension (mild).     Palpations: Abdomen is soft.     Tenderness: There is no abdominal tenderness. There is no guarding.  Musculoskeletal:     Right hip: He exhibits tenderness.     Left hip: He exhibits tenderness.  Skin:    General: Skin is warm and dry.  Neurological:     Mental Status: He is alert.      ED Treatments / Results  Labs (all labs ordered are listed, but only abnormal results are displayed) Labs Reviewed  URINALYSIS, ROUTINE W REFLEX MICROSCOPIC    EKG EKG  Interpretation  Date/Time:  Sunday August 11 2018 10:24:12 EST Ventricular Rate:  60 PR Interval:    QRS Duration: 100 QT Interval:  436 QTC Calculation: 436 R Axis:   -30 Text Interpretation:  Sinus rhythm Abnormal R-wave progression, early transition Left ventricular hypertrophy Anterior Q waves, possibly due to LVH No significant change since last tracing Confirmed by Gareth Morgan [id number]) on 08/11/2018 3:12:18 PM  Radiology Dg Pelvis 1-2 Views  Result Date: 08/11/2018 CLINICAL DATA:  82 year old who had an unwitnessed fall earlier today at the memory care facility. RIGHT hip discomfort on today's clinical examination. Initial encounter. EXAM: PELVIS - 1-2 VIEW COMPARISON:  Bone window images from CT abdomen and pelvis 06/18/2018 and CT pelvis 01/12/2004. FINDINGS: No evidence of acute fracture. Both hip joints anatomically aligned with mild MEDIAL joint space narrowing. Sclerotic lesion with central lucency in the RIGHT femoral neck, unchanged dating back to 2005 and therefore benign. Sacroiliac joints and symphysis pubis intact without significant degenerative changes. Mild degenerative changes involving the LOWER lumbar spine. Surgical clips and fiducial markers in the midline of the low pelvis related to prior prostatectomy. IMPRESSION: 1. No acute osseous abnormality. 2. Symmetric mild osteoarthritis in both hips. 3. Benign sclerotic lesion in the RIGHT femoral neck, stable dating back to 2005, likely a benign bone island. Electronically Signed   By: Evangeline Dakin M.D.   On: 08/11/2018 13:59   Ct Head Wo Contrast  Result Date: 08/11/2018 CLINICAL DATA:  Unwitnessed fall. Hematoma of the LEFT side of the forehead. History of dementia. Patient is combative. EXAM: CT HEAD WITHOUT CONTRAST CT CERVICAL SPINE WITHOUT CONTRAST TECHNIQUE: Multidetector CT imaging of the head and cervical spine was performed following the standard protocol without intravenous contrast. Multiplanar CT  image reconstructions of the cervical spine were also generated. COMPARISON:  02/05/2010 FINDINGS: CT HEAD FINDINGS Brain: Significant central and cortical atrophy. Periventricular white matter changes are consistent with small vessel disease. There is no intra or extra-axial fluid collection or mass lesion. The basilar cisterns and ventricles have a normal appearance. There is no CT evidence for acute infarction or hemorrhage. Vascular: There is dense atherosclerotic calcification of the internal carotid arteries. No hyperdense vessels. Skull: Normal. Negative for fracture or focal lesion. Sinuses/Orbits: No acute finding. Other: LEFT frontal scalp edema not associated with underlying fracture. CT CERVICAL SPINE FINDINGS Alignment: Normal. Skull base and vertebrae: No acute fracture. No primary bone lesion or focal pathologic process. Soft tissues and spinal canal: No prevertebral fluid or swelling. No visible canal hematoma. Disc levels:  Minimal disc height loss at C5-6. Upper chest: Negative. Other: None IMPRESSION: 1. Atrophy and small vessel disease. 2. No evidence for acute intracranial abnormality. 3. LEFT frontal scalp edema without associated fracture. 4. No evidence for acute cervical spine abnormality. Electronically Signed   By: Nolon Nations M.D.   On: 08/11/2018 12:08   Ct Cervical Spine Wo Contrast  Result Date: 08/11/2018 CLINICAL DATA:  Unwitnessed fall. Hematoma of the LEFT side of the forehead. History of dementia. Patient is combative. EXAM: CT HEAD WITHOUT CONTRAST CT CERVICAL SPINE WITHOUT CONTRAST TECHNIQUE: Multidetector CT imaging of the head and cervical spine was performed following the standard protocol without intravenous contrast. Multiplanar CT image reconstructions of the cervical spine were also generated. COMPARISON:  02/05/2010 FINDINGS: CT HEAD FINDINGS Brain: Significant central and cortical atrophy. Periventricular white matter changes are consistent with small vessel  disease. There is no intra or extra-axial fluid collection or mass lesion. The basilar cisterns and ventricles have a normal appearance. There is no CT evidence for acute infarction or hemorrhage. Vascular: There is dense atherosclerotic calcification of the internal carotid arteries. No hyperdense vessels. Skull: Normal. Negative for fracture or focal lesion. Sinuses/Orbits: No acute finding. Other: LEFT frontal scalp edema not associated with underlying fracture. CT CERVICAL SPINE FINDINGS Alignment: Normal. Skull base and vertebrae: No acute fracture. No primary bone lesion or focal  pathologic process. Soft tissues and spinal canal: No prevertebral fluid or swelling. No visible canal hematoma. Disc levels:  Minimal disc height loss at C5-6. Upper chest: Negative. Other: None IMPRESSION: 1. Atrophy and small vessel disease. 2. No evidence for acute intracranial abnormality. 3. LEFT frontal scalp edema without associated fracture. 4. No evidence for acute cervical spine abnormality. Electronically Signed   By: Nolon Nations M.D.   On: 08/11/2018 12:08    Procedures Procedures (including critical care time)  Medications Ordered in ED Medications - No data to display   Initial Impression / Assessment and Plan / ED Course  I have reviewed the triage vital signs and the nursing notes.  Pertinent labs & imaging results that were available during my care of the patient were reviewed by me and considered in my medical decision making (see chart for details).     82 year old male with a history of dementia, hypertension, hyperlipidemia presents with concern for fall at the facility.  Fall unwitnessed. Patient otherwise at baseline. Has had work up 3 weeks ago given decline including urine and has not had acute changes or symptoms of acute illness. I discussed possibility of labs with wife but in setting of patient otherwise at his baseline agree with foregoing labs at this time.  Vital signs stable,  mild bradycardia likely in setting of metoprolol use.   CT head and CSPine without acute abnormalities. XR pelvis WNL. Mild abdominal distention, however no hx of emesis, appears nontender, doubt acute abdominal pathology.  Neuro exam limited but nonfocal.  UA WNL.  Patient discharged in stable condition with understanding of reasons to return.   Final Clinical Impressions(s) / ED Diagnoses   Final diagnoses:  Fall, initial encounter    ED Discharge Orders    None       Gareth Morgan, MD 08/11/18 1610

## 2018-08-19 ENCOUNTER — Non-Acute Institutional Stay: Payer: Medicare Other | Admitting: Internal Medicine

## 2018-08-19 ENCOUNTER — Encounter: Payer: Self-pay | Admitting: Internal Medicine

## 2018-08-19 VITALS — BP 148/74 | HR 64 | Ht 71.0 in | Wt 191.0 lb

## 2018-08-19 DIAGNOSIS — Z515 Encounter for palliative care: Secondary | ICD-10-CM
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
## 2018-08-27 ENCOUNTER — Telehealth: Payer: Self-pay | Admitting: Internal Medicine

## 2018-08-27 NOTE — Telephone Encounter (Signed)
1pm:  Return TC to PCG spouse Ulis Rias, in answer request for call back to provide information Alzheimer's support groups. I did e-mail her the Alzheimer's number to identify groups which meet in our area ([place] and [facility]) as well as information for programs coordinated through [facility] (see below).  Ulis Rias mentions that patient seems less communicative and much less verbal; hardly speaking at all. He needs more cuing and direction to stand. Ulis Rias is wondering if Depakote may be contributing to these symptoms. We had decreased the dose to bid last week, but it seems reasonable to discontinue it all together and see if his lethargy improves. I will stop by the facility tomorrow to discontinue the Depakote.   Violeta Gelinas NP-C [phone]  [phone] Alzheimer's organization web site  Connect with Others at a [place] offers three on-going support groups.  Many times a support group is a great place to learn of local resources and help caregivers know that they are not alone.  Please join [place] for this informal gathering of individuals who are looking for a safe place to share and make connections with other caregivers. [place]: First Wednesday of each Month 1:00 - 2:30 p.m. [facility] at [facility], [employer], [address], [employer]: Second Tuesday of each Month 12:30 - 2:00 p.m. [employer], [address], [place]. Enter at Administrative entrance. [place]: Last Tuesday of each Month   5:45 - 7:15 p.m. [facility] (formerly the [employer] [facility] [employer]), [address], [place]. Please RSVP to April Franklin, Caregiver Support Specialist, at [phone] as a light dinner is provided. For more information, please contact April Franklin, Caregiver Support Specialist, at [phone].

## 2018-08-27 NOTE — Telephone Encounter (Signed)
4:20pm Faxed order to [facility], [facility], [employer] (fax # [phone]) to discontinue Depakote, d/t possible contribution of medication to patient's decreased communication/verbalizations.  Violeta Gelinas NP-C [phone]

## 2018-09-02 ENCOUNTER — Non-Acute Institutional Stay: Payer: Medicare Other | Admitting: Internal Medicine

## 2018-09-02 ENCOUNTER — Encounter: Payer: Self-pay | Admitting: Internal Medicine

## 2018-09-02 DIAGNOSIS — Z515 Encounter for palliative care: Secondary | ICD-10-CM
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
## 2018-09-04 ENCOUNTER — Ambulatory Visit: Payer: Medicare Other | Admitting: Podiatry

## 2018-09-05 ENCOUNTER — Emergency Department (HOSPITAL_COMMUNITY)
Admission: EM | Admit: 2018-09-05 | Discharge: 2018-09-10 | Disposition: A | Discharge status: Home / Self Care | Payer: Medicare Other | Attending: Emergency Medicine | Admitting: Emergency Medicine

## 2018-09-05 ENCOUNTER — Other Ambulatory Visit: Payer: Self-pay

## 2018-09-05 ENCOUNTER — Encounter (HOSPITAL_COMMUNITY): Payer: Self-pay | Admitting: *Deleted

## 2018-09-05 DIAGNOSIS — Z8546 Personal history of malignant neoplasm of prostate: Secondary | ICD-10-CM | POA: Diagnosis not present

## 2018-09-05 DIAGNOSIS — N139 Obstructive and reflux uropathy, unspecified: Secondary | ICD-10-CM | POA: Diagnosis not present

## 2018-09-05 DIAGNOSIS — F0281 Dementia in other diseases classified elsewhere with behavioral disturbance: Secondary | ICD-10-CM | POA: Diagnosis not present

## 2018-09-05 DIAGNOSIS — R609 Edema, unspecified: Secondary | ICD-10-CM | POA: Insufficient documentation

## 2018-09-05 DIAGNOSIS — Z85828 Personal history of other malignant neoplasm of skin: Secondary | ICD-10-CM | POA: Insufficient documentation

## 2018-09-05 DIAGNOSIS — Z87891 Personal history of nicotine dependence: Secondary | ICD-10-CM | POA: Insufficient documentation

## 2018-09-05 DIAGNOSIS — N289 Disorder of kidney and ureter, unspecified: Secondary | ICD-10-CM | POA: Insufficient documentation

## 2018-09-05 DIAGNOSIS — G309 Alzheimer's disease, unspecified: Secondary | ICD-10-CM | POA: Insufficient documentation

## 2018-09-05 DIAGNOSIS — Z85850 Personal history of malignant neoplasm of thyroid: Secondary | ICD-10-CM | POA: Insufficient documentation

## 2018-09-05 DIAGNOSIS — I10 Essential (primary) hypertension: Secondary | ICD-10-CM | POA: Diagnosis not present

## 2018-09-05 DIAGNOSIS — F03918 Unspecified dementia, unspecified severity, with other behavioral disturbance: Secondary | ICD-10-CM | POA: Diagnosis present

## 2018-09-05 DIAGNOSIS — E039 Hypothyroidism, unspecified: Secondary | ICD-10-CM | POA: Insufficient documentation

## 2018-09-05 DIAGNOSIS — F0391 Unspecified dementia with behavioral disturbance: Secondary | ICD-10-CM

## 2018-09-05 DIAGNOSIS — Z79899 Other long term (current) drug therapy: Secondary | ICD-10-CM | POA: Diagnosis not present

## 2018-09-05 DIAGNOSIS — Z008 Encounter for other general examination: Secondary | ICD-10-CM

## 2018-09-05 DIAGNOSIS — R451 Restlessness and agitation: Secondary | ICD-10-CM | POA: Diagnosis present

## 2018-09-05 LAB — CBC WITH DIFFERENTIAL/PLATELET
Abs Immature Granulocytes: 0.01 10*3/uL (ref 0.00–0.07)
Basophils Absolute: 0 10*3/uL (ref 0.0–0.1)
Basophils Relative: 1 %
Eosinophils Absolute: 0.1 10*3/uL (ref 0.0–0.5)
Eosinophils Relative: 2 %
HCT: 38.3 % — ABNORMAL LOW (ref 39.0–52.0)
Hemoglobin: 12 g/dL — ABNORMAL LOW (ref 13.0–17.0)
Immature Granulocytes: 0 %
Lymphocytes Relative: 25 %
Lymphs Abs: 1.8 10*3/uL (ref 0.7–4.0)
MCH: 31.5 pg (ref 26.0–34.0)
MCHC: 31.3 g/dL (ref 30.0–36.0)
MCV: 100.5 fL — ABNORMAL HIGH (ref 80.0–100.0)
Monocytes Absolute: 0.5 10*3/uL (ref 0.1–1.0)
Monocytes Relative: 7 %
Neutro Abs: 4.6 10*3/uL (ref 1.7–7.7)
Neutrophils Relative %: 65 %
Platelets: 236 10*3/uL (ref 150–400)
RBC: 3.81 MIL/uL — ABNORMAL LOW (ref 4.22–5.81)
RDW: 13.8 % (ref 11.5–15.5)
WBC: 7.1 10*3/uL (ref 4.0–10.5)
nRBC: 0 % (ref 0.0–0.2)

## 2018-09-05 LAB — BASIC METABOLIC PANEL
Anion gap: 8 (ref 5–15)
BUN: 30 mg/dL — ABNORMAL HIGH (ref 8–23)
CO2: 26 mmol/L (ref 22–32)
Calcium: 8.5 mg/dL — ABNORMAL LOW (ref 8.9–10.3)
Chloride: 107 mmol/L (ref 98–111)
Creatinine, Ser: 1.81 mg/dL — ABNORMAL HIGH (ref 0.61–1.24)
GFR calc Af Amer: 40 mL/min — ABNORMAL LOW (ref >60)
GFR calc non Af Amer: 35 mL/min — ABNORMAL LOW (ref >60)
Glucose, Bld: 130 mg/dL — ABNORMAL HIGH (ref 70–99)
Potassium: 3.8 mmol/L (ref 3.5–5.1)
Sodium: 141 mmol/L (ref 135–145)

## 2018-09-05 LAB — URINALYSIS, ROUTINE W REFLEX MICROSCOPIC
Bilirubin Urine: NEGATIVE
Glucose, UA: NEGATIVE mg/dL
Hgb urine dipstick: NEGATIVE
Ketones, ur: NEGATIVE mg/dL
Leukocytes, UA: NEGATIVE
Nitrite: NEGATIVE
Protein, ur: NEGATIVE mg/dL
Specific Gravity, Urine: 1.012 (ref 1.005–1.030)
pH: 6 (ref 5.0–8.0)

## 2018-09-05 MED ORDER — ALPRAZOLAM 0.25 MG PO TABS: 0.2500 mg | ORAL_TABLET | Freq: Every day | ORAL | Status: DC | PRN

## 2018-09-05 MED ORDER — LEVOTHYROXINE SODIUM 137 MCG PO TABS
137.0000 ug | ORAL_TABLET | Freq: Every day | ORAL | Status: DC
  Administered 2018-09-06 – 2018-09-10 (×5): 137 ug via ORAL
  Filled 2018-09-05 (×7): qty 1

## 2018-09-05 MED ORDER — OLANZAPINE 5 MG PO TBDP
5.0000 mg | ORAL_TABLET | Freq: Every day | ORAL | Status: DC
  Administered 2018-09-05 – 2018-09-10 (×5): 5 mg via ORAL
  Filled 2018-09-05 (×5): qty 1

## 2018-09-05 MED ORDER — HALOPERIDOL LACTATE 5 MG/ML IJ SOLN
2.0000 mg | Freq: Once | INTRAMUSCULAR | Status: AC
  Administered 2018-09-05: 2 mg via INTRAMUSCULAR
  Filled 2018-09-05: qty 1

## 2018-09-05 MED ORDER — LORAZEPAM 2 MG/ML IJ SOLN: 2.0000 mg | Freq: Once | INTRAMUSCULAR | Status: DC

## 2018-09-05 MED ORDER — ASPIRIN EC 81 MG PO TBEC
81.0000 mg | DELAYED_RELEASE_TABLET | Freq: Every day | ORAL | Status: DC
  Administered 2018-09-06 – 2018-09-10 (×5): 81 mg via ORAL
  Filled 2018-09-05 (×5): qty 1

## 2018-09-05 MED ORDER — METOPROLOL TARTRATE 25 MG PO TABS
25.0000 mg | ORAL_TABLET | Freq: Every day | ORAL | Status: DC
  Administered 2018-09-05 – 2018-09-10 (×6): 25 mg via ORAL
  Filled 2018-09-05 (×6): qty 1
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
## 2018-09-05 NOTE — ED Notes (Signed)
Provider uro in and out done

## 2018-09-05 NOTE — ED Notes (Signed)
Pt combative when attempting bladder scan. Will not comply with treatment or vitals.

## 2018-09-05 NOTE — ED Notes (Signed)
Wife and patient declined I/O cath. Wife states, "He has had prostate problems in the past and only a Urologist can cath him." Urinal left at bedside.
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
## 2018-09-05 NOTE — ED Notes (Signed)
TTS at bedside. 
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
## 2018-09-05 NOTE — ED Notes (Signed)
Refused lab aggressive at this time
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
## 2018-09-05 NOTE — ED Notes (Signed)
Family agitated that they are still waiting. This RN explained to family that we are willing to in and out catheterized him and she states that would be traumatic. Will speak with MD.

## 2018-09-05 NOTE — Consult Note (Signed)
Consultation: Urgent continence, incomplete bladder emptying, history of urethral stricture and prostate cancer Requested by: Dr. Lacretia Leigh  History of Present Illness: [name] is an 83 year old male with a history of dementia.  He was more combative and needs to be evaluated by psychiatry.  As always there is a concern a medical problem might be exacerbating the mental status and a urinalysis was needed.  The patient is incontinent and voids in the diaper.  A bladder scan was 500 mL's and it was thought he might be in retention.  However, he voided before I arrived and soaked his diaper.  I did an in and out cath and drained 200 cc.  Nurse Doren Custard sent the urine specimen and it is clear.  The patient does not have a urethral stricture.  The patient has a history of urethral stricture dilated by Dr. Alinda Money in 2012.  Since then he follows with Dr. Rosana Hoes at [place].  On care everywhere Dr. Rosana Hoes perform cystoscopy to evaluate frequency, urgency and urge incontinence in October 2019.  Cystoscopy was normal without stricture.  The decision was then was it is best to let the patient void in a diaper and avoid any catheters or medications which have side effects.  Also the patient has a history of a radical prostatectomy and salvage radiation.  He had PSA recurrence but had a sudden unexpected remission a few years ago.  He follows with hematology oncology at [facility] and continues to have a low PSA which was less than 0.0 12 Dec 2017.  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   Past Medical History:  Diagnosis Date  . Anemia   . Hernia   . Hyperlipemia   . Hypertension   . Memory loss   . Obstructive nephropathy   . Prostate CA ([place])   . 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 BACK  SURGERY  1986   Lumbar Laminectomy  . CATARACT EXTRACTION Bilateral R eye 03/2013, L eye09/2014   . colon polyps  Nov. 13, 2006  . EYE SURGERY Bilateral    Cataract  . HERNIA REPAIR  6578,  4696   Umbilical and Left inguinal   . JOINT REPLACEMENT    . PROSTATE SURGERY    . PROSTATECTOMY  2004   Radical   . shingles in right eye    . SKIN CANCER EXCISION  Nov. 10, 2011   Left scalp and Left forearm- SCC  . SPINE SURGERY  1986   Lumbar laminectomy  . THYROID LOBECTOMY Bilateral     Home Medications:  (Not in a hospital admission)  Allergies: No Known Allergies  Family History  Problem Relation Age of Onset  . Kidney disease Mother        ESRD  . Cancer Mother   . Heart disease Father   . Stroke Father    Social History:  reports that he quit smoking about 34 years ago. He has never used smokeless tobacco. He reports current alcohol use of about 7.0 - 10.0 standard drinks of alcohol per week. He reports that he does not use drugs.  ROS: A complete review of systems was performed.  All systems are negative except for pertinent findings as noted. Review  of Systems  Unable to perform ROS: Dementia     Physical Exam:  Vital signs in last 24 hours: Temp:  [97.5 F (36.4 C)-98.4 F (36.9 C)] 98.4 F (36.9 C) (01/23 2155) Pulse Rate:  [59-78] 59 (01/23 2155) Resp:  [16-18] 16 (01/23 2155) BP: (129-158)/(59-95) 158/76 (01/23 2155) SpO2:  [92 %-100 %] 97 % (01/23 2155) General:  Alert, No acute distress, in restraints HEENT: Normocephalic, atraumatic Cardiovascular: Regular rate and rhythm Lungs: Regular rate and effort Abdomen: Soft, nontender, nondistended, no abdominal masses Back: No CVA tenderness Extremities: No edema Neurologic: Grossly intact GU: The penis and meatus appeared normal.  His diaper is soaked with urine.  Procedure: The penis was prepped and a 92 [place] in and out catheter was advanced without difficulty.  Urine was drained into the been and then  a midstream into the collecting cup.  Finally, the rest drained into the bin.  I poured this into the urinal and he had about 200 mL in his bladder.  Laboratory Data:  Results for orders placed or performed during the hospital encounter of 09/05/18 (from the past 24 hour(s))  CBC with Differential     Status: Abnormal   Collection Time: 09/05/18  3:28 PM  Result Value Ref Range   WBC 7.1 4.0 - 10.5 K/uL   RBC 3.81 (L) 4.22 - 5.81 MIL/uL   Hemoglobin 12.0 (L) 13.0 - 17.0 g/dL   HCT 38.3 (L) 39.0 - 52.0 %   MCV 100.5 (H) 80.0 - 100.0 fL   MCH 31.5 26.0 - 34.0 pg   MCHC 31.3 30.0 - 36.0 g/dL   RDW 13.8 11.5 - 15.5 %   Platelets 236 150 - 400 K/uL   nRBC 0.0 0.0 - 0.2 %   Neutrophils Relative % 65 %   Neutro Abs 4.6 1.7 - 7.7 K/uL   Lymphocytes Relative 25 %   Lymphs Abs 1.8 0.7 - 4.0 K/uL   Monocytes Relative 7 %   Monocytes Absolute 0.5 0.1 - 1.0 K/uL   Eosinophils Relative 2 %   Eosinophils Absolute 0.1 0.0 - 0.5 K/uL   Basophils Relative 1 %   Basophils Absolute 0.0 0.0 - 0.1 K/uL   Immature Granulocytes 0 %   Abs Immature Granulocytes 0.01 0.00 - 0.07 K/uL  Basic metabolic panel     Status: Abnormal   Collection Time: 09/05/18  3:28 PM  Result Value Ref Range   Sodium 141 135 - 145 mmol/L   Potassium 3.8 3.5 - 5.1 mmol/L   Chloride 107 98 - 111 mmol/L   CO2 26 22 - 32 mmol/L   Glucose, Bld 130 (H) 70 - 99 mg/dL   BUN 30 (H) 8 - 23 mg/dL   Creatinine, Ser 1.81 (H) 0.61 - 1.24 mg/dL   Calcium 8.5 (L) 8.9 - 10.3 mg/dL   GFR calc non Af Amer 35 (L) >60 mL/min   GFR calc Af Amer 40 (L) >60 mL/min   Anion gap 8 5 - 15  Urinalysis, Routine w reflex microscopic     Status: Abnormal   Collection Time: 09/05/18  9:12 PM  Result Value Ref Range   Color, Urine STRAW (A) YELLOW   APPearance CLEAR CLEAR   Specific Gravity, Urine 1.012 1.005 - 1.030   pH 6.0 5.0 - 8.0   Glucose, UA NEGATIVE NEGATIVE mg/dL   Hgb urine dipstick NEGATIVE NEGATIVE   Bilirubin Urine NEGATIVE NEGATIVE    Ketones, ur NEGATIVE NEGATIVE mg/dL   Protein, ur  NEGATIVE NEGATIVE mg/dL   Nitrite NEGATIVE NEGATIVE   Leukocytes, UA NEGATIVE NEGATIVE   No results found for this or any previous visit (from the past 240 hour(s)). Creatinine: Recent Labs    09/05/18 1528  CREATININE 1.81*    Impression/Assessment/plan:  Frequency, urgency, urge incontinence- this is at baseline.  His urinalysis is clear.  There is no clinically significant urinary retention.  As I discussed with his wife, the urine odor is simply urine in the diaper and on the skin that may contain some bacteria, but this type of colonization is not considered an infection.  He can follow-up with Dr. Rosana Hoes as planned.  History of prostate cancer- PSA remains low and he appears to remain in remission.  Festus Aloe 09/05/2018, 10:10 PM

## 2018-09-05 NOTE — ED Notes (Signed)
uro cart ready for provider

## 2018-09-05 NOTE — ED Notes (Signed)
Bed: HK25 Expected date:  Expected time:  Means of arrival:  Comments: EMS 82yo nursing home feet swelling several weeks, dementia
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
## 2018-09-05 NOTE — ED Triage Notes (Signed)
Pt arrives via [facility] from [facility]. Per EMS: Pt has bilateral leg and feet swelling for 5 weeks. Pt does not have a hx of CHF and per EMS cannot take diuretics due to a kidney condition.Pt was combative at facility today. Pt has hx of dementia.

## 2018-09-06 ENCOUNTER — Emergency Department (HOSPITAL_COMMUNITY): Payer: Medicare Other

## 2018-09-06 DIAGNOSIS — F0391 Unspecified dementia with behavioral disturbance: Secondary | ICD-10-CM

## 2018-09-06 LAB — TSH: TSH: 1.421 u[IU]/mL (ref 0.350–4.500)

## 2018-09-06 LAB — RAPID URINE DRUG SCREEN, HOSP PERFORMED
Amphetamines: NOT DETECTED
Barbiturates: NOT DETECTED
Benzodiazepines: POSITIVE — AB
Cocaine: NOT DETECTED
Opiates: NOT DETECTED
Tetrahydrocannabinol: NOT DETECTED

## 2018-09-06 LAB — ETHANOL: Alcohol, Ethyl (B): <10 mg/dL (ref <10)

## 2018-09-06 MED ORDER — OLANZAPINE 2.5 MG PO TABS
2.5000 mg | ORAL_TABLET | Freq: Two times a day (BID) | ORAL | Status: DC | PRN
  Administered 2018-09-08: 2.5 mg via ORAL
  Filled 2018-09-06: qty 1

## 2018-09-06 MED ORDER — LORAZEPAM 1 MG PO TABS
1.0000 mg | ORAL_TABLET | Freq: Once | ORAL | Status: AC
  Administered 2018-09-06: 1 mg via ORAL
  Filled 2018-09-06: qty 1

## 2018-09-06 NOTE — Progress Notes (Signed)
2nd shift ED CSW received a handoff from the 1st shift [facility] ED Dispostion.   Once results of testing for ETOH are in CSW will fax results to [facility] at fax: [phone] and call [phone] to confirm results were received.   CSW awaiting results now.  CSW will continue to follow for D/C needs.  Alphonse Guild. Liala Codispoti, LCSW, LCAS, CSI Clinical Social Worker Ph: [phone]

## 2018-09-06 NOTE — Progress Notes (Signed)
09/06/2018  @0840   TSH drawn. Gold top sent to main lab.

## 2018-09-06 NOTE — Progress Notes (Signed)
09/06/2018  0845  Patient wife called c/o that she was told by staff to come at 0830 to see patient. Wife was advised that visitation does not begin until 10am. Copy of visitation guidelines given to patient's wife by registration.
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
## 2018-09-06 NOTE — Progress Notes (Signed)
Results of ETOH testing sent to [facility] via fax.  CSW will continue to follow for D/C needs.  Alphonse Guild. Terrill Wauters, LCSW, LCAS, CSI Clinical Social Worker Ph: [phone]
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
## 2018-09-06 NOTE — Progress Notes (Signed)
09/06/2018  1545  Ethanol level drawn. Dark green tube sent to main lab.

## 2018-09-06 NOTE — ED Notes (Signed)
FAMILY CONTACT/WIFE KYE CARTER/ [phone].

## 2018-09-06 NOTE — Progress Notes (Signed)
09/06/2018  1800  Let one wrist out of restraint. Patient began hitting and swing at staff. Placed wrist back in restraints. Pt has bilateral wrist restraint on.

## 2018-09-07 DIAGNOSIS — F918 Other conduct disorders: Secondary | ICD-10-CM

## 2018-09-07 DIAGNOSIS — F0281 Dementia in other diseases classified elsewhere with behavioral disturbance: Secondary | ICD-10-CM

## 2018-09-07 NOTE — Progress Notes (Addendum)
Per the [facility] ED Psychiatric Team, pt is appropriate for being referred for inpatient geropsyche psychiatric treatment.  Pt was referred to the following facilities:  [facility]  [facility]  [facility]                                      CSW will continue to follow for D/C needs.  OF NOTE: Pt's referral to [facility] is currently under review by [facility] medical team, per admissions.  Alphonse Guild. Sakai Wolford, LCSW, LCAS, CSI Clinical Social Worker Ph: [phone]

## 2018-09-07 NOTE — Progress Notes (Addendum)
Pt's wife who states she is power of attorney and wants to be notified at ph: [phone], if pt is offered placement so she can decide whether to take pt home or to allow pt to be transported for inpatient psychiatric before the pt is transported.    RN updated.  CSW will continue to follow for D/C needs.  Alphonse Guild. Ayla Dunigan, LCSW, LCAS, CSI Clinical Social Worker Ph: [phone]

## 2018-09-07 NOTE — Progress Notes (Signed)
CSW received a call from pt's wife who stated she talked to [facility] on-call provider who stated a referral must be sent to the [facility].  CSW stated he must get permission from the Psychiatric Team, spoke to the Psychiatric Team at [facility] ED, was given permission and sent referral to [facility] via the hub.  At direction of the Psychiatric Team at [facility] ED [place] contacted [facility] at ph: Phone: [phone] and Fax: [phone] and left a VM with admissions dept and requested an update on pt's referral.  CSW called pt's wife [name] back and updated her.  CSW will continue to follow for D/C needs.  [name]. Johnny Meininger, LCSW, LCAS, CSI Clinical Social Worker Ph: [phone]      `

## 2018-09-07 NOTE — Consult Note (Addendum)
[facility] Psychiatry Consult   Reason for Consult:  Aggressive behavior Referring Physician:  EDP Patient Identification: [name] MRN:  [id number] Principal Diagnosis: Dementia with behavioral disturbance [facility]) Diagnosis:  Principal Problem:   Dementia with behavioral disturbance ([place])  Total Time spent with patient: 30 minutes  Subjective:   [name] is a 83 y.o. male patient admitted with aggressive behavior at his memory care unit.  HPI:  On admission per TTS, Pt was seen and chart reviewed with treatment team and Dr Mariea Clonts.  Pt was admitted from [facility] care with an episode of aggression in the face of dementia and multiple medication changes recently. His wife is present in the room. Pt was started on Depakote 125 mg TID in December and ever since then he has declined. He was able to do his ADL's but now cannot. He is currently incontinent and prior to the Depakote he was not. Pt was taken off Depakote in mid January and placed on Zyprexa 2.5 mg at bedtime. He has continued to be aggressive. Of note, he has been receiving benzos at the memory care unit. Pt was calm when he first arrived to [facility] on 09-05-2018 but overnight and this morning was kicking and hitting at staff. He was placed in bilateral wrist restraints. He was given Haldol 2 mg last night for aggression and this morning 1 mg of Ativan was given by the EDP's. There was a progress note placed in his chart by this writer yesterday to please not give Pt benzos but he did receive some this morning. Pt's Zyprexa was increased  From 2.5 mg to 5 mg at bedtime and Pt may have Zyprexa 2.5 mg BID PRN for agitation if needed. Pt has a history of hypothyroidism and his TSH was checked, it is normal at 1.421, Pt is on Synthroid 137 mcg daily. Pt is in need of a geropsych inpatient admission for stabilization and medication management. Will continue to observe until placement is found.   On assessment today,  [name] resting in bed with his eyes closed.  He did not respond when the mental health team entered his room.  Collateral information received from nursing staff, patient was agitated earlier today and required physical restraints.    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  : No Does patient have [facility] services? : No Does patient have P4CC services?: No  Past Medical History:  Past Medical History:  Diagnosis Date  . Anemia   . Hernia   . Hyperlipemia   . Hypertension   . Memory loss   . Obstructive nephropathy   . Prostate CA ([place])   .  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 BACK SURGERY  1986   Lumbar Laminectomy  . CATARACT EXTRACTION Bilateral R eye 03/2013, L eye09/2014   . colon polyps  Nov. 13, 2006  . EYE SURGERY Bilateral    Cataract  . HERNIA REPAIR  7939,  0300   Umbilical and Left  inguinal   . JOINT REPLACEMENT    . PROSTATE SURGERY    . PROSTATECTOMY  2004   Radical   . shingles in right eye    . SKIN CANCER EXCISION  Nov. 10, 2011   Left scalp and Left forearm- SCC  . SPINE SURGERY  1986   Lumbar laminectomy  . THYROID LOBECTOMY Bilateral    Family History:  Family History  Problem Relation Age of Onset  . Kidney disease Mother        ESRD  . Cancer Mother   . Heart disease Father   . Stroke Father    Family Psychiatric  History: None per chart review.  Social History:  Social History   Substance and Sexual Activity  Alcohol Use Yes  . Alcohol/week: 7.0 - 10.0 standard drinks  . Types: 7 - 10 Glasses of wine per week   Comment: everyday     Social History   Substance and Sexual Activity  Drug Use No    Social History   Socioeconomic History  . Marital status: Married    Spouse name: Not on file  . Number of children: Not on file  . Years of education: Not on file  . Highest education level: Not on file  Occupational History  . Not on file  Social Needs  . Financial resource strain: Not on file  . Food insecurity:    Worry: Not on file    Inability: Not on file  . Transportation needs:    Medical: Not on file    Non-medical: Not on file  Tobacco Use  . Smoking status: Former Smoker    Last attempt to quit: 08/14/1984    Years since quitting: 34.0  . Smokeless tobacco: Never Used  Substance and Sexual Activity  . Alcohol use: Yes    Alcohol/week: 7.0 - 10.0 standard drinks    Types: 7 - 10 Glasses of wine per week    Comment: everyday  . Drug use: No  . Sexual activity: Never  Lifestyle  . Physical activity:    Days per week: Not on file    Minutes per session: Not on file  . Stress: Not on file  Relationships  . Social connections:    Talks on phone: Not on file    Gets together: Not on file    Attends religious service: Not on file    Active member of club or organization: Not on file    Attends meetings of clubs or  organizations: Not on file    Relationship status: Not on file  Other Topics Concern  . Not on file  Social History Narrative  . Not on file   Additional Social History: N/A    Allergies:  No Known Allergies  Labs:  Results for orders placed or performed during the hospital encounter of 09/05/18 (from the past 48 hour(s))  CBC with Differential     Status: Abnormal   Collection Time:  09/05/18  3:28 PM  Result Value Ref Range   WBC 7.1 4.0 - 10.5 K/uL   RBC 3.81 (L) 4.22 - 5.81 MIL/uL   Hemoglobin 12.0 (L) 13.0 - 17.0 g/dL   HCT 38.3 (L) 39.0 - 52.0 %   MCV 100.5 (H) 80.0 - 100.0 fL   MCH 31.5 26.0 - 34.0 pg   MCHC 31.3 30.0 - 36.0 g/dL   RDW 13.8 11.5 - 15.5 %   Platelets 236 150 - 400 K/uL   nRBC 0.0 0.0 - 0.2 %   Neutrophils Relative % 65 %   Neutro Abs 4.6 1.7 - 7.7 K/uL   Lymphocytes Relative 25 %   Lymphs Abs 1.8 0.7 - 4.0 K/uL   Monocytes Relative 7 %   Monocytes Absolute 0.5 0.1 - 1.0 K/uL   Eosinophils Relative 2 %   Eosinophils Absolute 0.1 0.0 - 0.5 K/uL   Basophils Relative 1 %   Basophils Absolute 0.0 0.0 - 0.1 K/uL   Immature Granulocytes 0 %   Abs Immature Granulocytes 0.01 0.00 - 0.07 K/uL    Comment: Performed at [facility], [place] [address] [place], [place] [address]  Basic metabolic panel     Status: Abnormal   Collection Time: 09/05/18  3:28 PM  Result Value Ref Range   Sodium 141 135 - 145 mmol/L   Potassium 3.8 3.5 - 5.1 mmol/L   Chloride 107 98 - 111 mmol/L   CO2 26 22 - 32 mmol/L   Glucose, Bld 130 (H) 70 - 99 mg/dL   BUN 30 (H) 8 - 23 mg/dL   Creatinine, Ser 1.81 (H) 0.61 - 1.24 mg/dL   Calcium 8.5 (L) 8.9 - 10.3 mg/dL   GFR calc non Af Amer 35 (L) >60 mL/min   GFR calc Af Amer 40 (L) >60 mL/min   Anion gap 8 5 - 15    Comment: Performed at [facility], [place] [address] [place], [place] [address]  Urinalysis, Routine w reflex microscopic     Status: Abnormal   Collection Time: 09/05/18  9:12 PM   Result Value Ref Range   Color, Urine STRAW (A) YELLOW   APPearance CLEAR CLEAR   Specific Gravity, Urine 1.012 1.005 - 1.030   pH 6.0 5.0 - 8.0   Glucose, UA NEGATIVE NEGATIVE mg/dL   Hgb urine dipstick NEGATIVE NEGATIVE   Bilirubin Urine NEGATIVE NEGATIVE   Ketones, ur NEGATIVE NEGATIVE mg/dL   Protein, ur NEGATIVE NEGATIVE mg/dL   Nitrite NEGATIVE NEGATIVE   Leukocytes, UA NEGATIVE NEGATIVE    Comment: Performed at [facility] [address], [place], [place] [address]  Urine rapid drug screen (hosp performed)     Status: Abnormal   Collection Time: 09/05/18  9:12 PM  Result Value Ref Range   Opiates NONE DETECTED NONE DETECTED   Cocaine NONE DETECTED NONE DETECTED   Benzodiazepines POSITIVE (A) NONE DETECTED   Amphetamines NONE DETECTED NONE DETECTED   Tetrahydrocannabinol NONE DETECTED NONE DETECTED   Barbiturates NONE DETECTED NONE DETECTED    Comment: (NOTE) DRUG SCREEN FOR MEDICAL PURPOSES ONLY.  IF CONFIRMATION IS NEEDED FOR ANY PURPOSE, NOTIFY LAB WITHIN 5 DAYS. LOWEST DETECTABLE LIMITS FOR URINE DRUG SCREEN Drug Class                     Cutoff (ng/mL) Amphetamine and metabolites    1000 Barbiturate and metabolites    200 Benzodiazepine  242 Tricyclics and metabolites     300 Opiates and metabolites        300 Cocaine and metabolites        300 THC                            50 Performed at [facility], [place] [address], [place], [place] [address]   Ethanol     Status: None   Collection Time: 09/06/18  3:45 PM  Result Value Ref Range   Alcohol, Ethyl (B) <10 <10 mg/dL    Comment: (NOTE) Lowest detectable limit for serum alcohol is 10 mg/dL. For medical purposes only. Performed at [facility], [place] [address], [place], [place] [address]     Current Facility-Administered Medications  Medication Dose Route Frequency Provider Last Rate Last Dose  . aspirin EC tablet 81 mg  81 mg  Oral Daily Lacretia Leigh, MD   81 mg at 09/07/18 1038  . levothyroxine (SYNTHROID, LEVOTHROID) tablet 137 mcg  137 mcg Oral Q0600 Lacretia Leigh, MD   137 mcg at 09/07/18 [phone]  . metoprolol tartrate (LOPRESSOR) tablet 25 mg  25 mg Oral Daily Lacretia Leigh, MD   25 mg at 09/07/18 1038  . OLANZapine (ZYPREXA) tablet 2.5 mg  2.5 mg Oral BID PRN Ethelene Hal, NP      . OLANZapine zydis (ZYPREXA) disintegrating tablet 5 mg  5 mg Oral QHS Ethelene Hal, NP   5 mg at 09/06/18 2238   Current Outpatient Medications  Medication Sig Dispense Refill  . acetaminophen (TYLENOL) 500 MG tablet Take 1,000 mg by mouth 2 (two) times daily as needed (pain).     Marland Kitchen ALPRAZolam (XANAX) 0.25 MG tablet Take 0.25 mg by mouth daily as needed for anxiety (agitation). Once a day prn agitation    . aspirin 81 MG tablet Take 81 mg by mouth daily.      Marland Kitchen levothyroxine (SYNTHROID, LEVOTHROID) 137 MCG tablet     . Melatonin 3 MG TABS Take 3 mg by mouth. 3mg  q hs    . metoprolol tartrate (LOPRESSOR) 25 MG tablet Take 25 mg by mouth daily.     . miconazole (BAZA ANTIFUNGAL) 2 % cream Apply 1 application topically 3 (three) times daily. Apply on buttocks/sacral area    . OLANZapine (ZYPREXA) 2.5 MG tablet Take 2.5 mg by mouth at bedtime.    . polyethylene glycol (MIRALAX / GLYCOLAX) packet Take 17 g by mouth daily as needed. And as needed for constipation     . sertraline (ZOLOFT) 50 MG tablet Take 50 mg by mouth daily.       Musculoskeletal: Strength & Muscle Tone: within normal limits Gait & Station: normal Patient leans: N/A  Psychiatric Specialty Exam: Physical Exam  Nursing note and vitals reviewed. Constitutional: He appears well-developed and well-nourished.  HENT:  Head: Normocephalic.  Eyes: Pupils are equal, round, and reactive to light.  Neck: Normal range of motion.  Cardiovascular: Normal rate.  Respiratory: Effort normal.  Musculoskeletal: Normal range of motion.  Neurological: He is  alert.  Psychiatric: His speech is normal. Thought content normal. His affect is labile. He is agitated. Cognition and memory are impaired. He expresses impulsivity.    Review of Systems  Psychiatric/Behavioral: Positive for memory loss.  All other systems reviewed and are negative.   Blood pressure (!) 154/90, pulse 72, temperature 97.8 F (36.6 C), temperature source Oral, resp. rate  20, SpO2 97 %.There is no height or weight on file to calculate BMI.  General Appearance: Casual  Eye Contact:  Fair  Speech:  Clear and Coherent  Volume:  Normal  Mood:  UTA, Pt has dementia  Affect:  Labile  Thought Process:  Disorganized  Orientation:  Other:  UTA, dementia  Thought Content:  Illogical and Dementia  Suicidal Thoughts:  UTA, dementia  Homicidal Thoughts:  UTA, dementia  Memory:  Immediate;   Poor Recent;   Poor Remote;   Poor  Judgement:  Impaired  Insight:  Lacking  Psychomotor Activity:  Restlessness  Concentration:  Concentration: Poor and Attention Span: Poor  Recall:  Poor  Fund of Knowledge:  Poor  Language:  Fair  Akathisia:  No  Handed:  Right  AIMS (if indicated):   N/A  Assets:  Financial Resources/Insurance Housing Social Support  ADL's:  Impaired  Cognition:  Impaired,  Moderate  Sleep:   N/A     Treatment Plan Summary: Daily contact with patient to assess and evaluate symptoms and progress in treatment and Medication management  Crisis Stabilization:  Dementia with behavior changes: Medication management: -Zyprexa 5 mg QHS for mood stabilization -Zyprexa 2.5 mg BID PRN for agitation/aggression -Synthroid 137 mcg daily for hypothyroidism  Disposition: Recommend psychiatric Inpatient admission when medically cleared. Recommend [facility] psych placement  Waylan Boga, NP 09/07/2018 12:05 PM   Patient seen face-to-face for psychiatric evaluation, chart reviewed and case discussed with the physician extender and developed treatment plan. Reviewed the  information documented and agree with the treatment plan.

## 2018-09-07 NOTE — Progress Notes (Signed)
Pt's wife and POA is reaching out to [facility] at:  Address: [place], [place], [place] [address] Phone: [phone]  To seek placement for the pt and will contact the ED CSW at [phone] when she is contacted by their on-call provider regarding admission criteria.  2nd shift ED CSW will leave handoff for 1st shift ED CSW.  Please reconsult if future social work needs arise.  CSW signing off, as social work intervention is no longer needed.  Alphonse Guild. Hillary Schwegler, LCSW, LCAS, CSI Clinical Social Worker Ph: [phone]

## 2018-09-07 NOTE — Progress Notes (Signed)
CSW called [facility] and was told the ETOH results from 1/24 were not received.  CSW re-faxed ETO results and Gwyndolyn Saxon from [facility] called to say results have been received and pt is being considered for the wait-list and prioritization.  CSW will continue to follow for D/C needs.  Alphonse Guild. Rigdon Macomber, LCSW, LCAS, CSI Clinical Social Worker Ph: [phone]

## 2018-09-07 NOTE — ED Notes (Signed)
Patient denies pain and is resting comfortably.  

## 2018-09-07 NOTE — ED Notes (Signed)
His wife has just arrived for a visit. The sitter [facility]) is assisting him with eating his lunch. His appetite seems quite good.

## 2018-09-07 NOTE — Progress Notes (Signed)
CSW received a request from pt's RN to speak to the pt's wife who presented by phone as upset AEB tearfully and emotionally expressing by phone that the pt's wife was fearful of pt being placed far away, ie [place].  CSW called pt's wife [name] [phone] and left a HIPPA-compliant VM requesting a call back.  CSW will continue to follow for D/C needs.  Alphonse Guild. Farrie Sann, LCSW, LCAS, CSI Clinical Social Worker Ph: [phone]

## 2018-09-07 NOTE — ED Notes (Signed)
He is completely resistive of care. Order rec'd. For non-violent restrains. Soft 4-point restraints are on. He is in no distress. His sitter [facility]) has fed him his breakfast.

## 2018-09-08 MED ORDER — OLANZAPINE 2.5 MG PO TABS: 2.5000 mg | ORAL_TABLET | Freq: Two times a day (BID) | ORAL | 0 refills | Status: DC | PRN

## 2018-09-08 MED ORDER — OLANZAPINE 5 MG PO TBDP: 5.0000 mg | ORAL_TABLET | Freq: Every day | ORAL | 0 refills | Status: DC

## 2018-09-08 NOTE — ED Notes (Signed)
Pt continues to sleep, at intervals we have attempted to waken him to eat and take meds. Pt will not wake up enough to eat or take meds.

## 2018-09-08 NOTE — ED Notes (Signed)
Admissions coordinator will assess pt tomorrow, therefore changed status from discharge to boarder. Updated CN Manuela Schwartz RN with status.

## 2018-09-08 NOTE — Consult Note (Addendum)
[facility] ED Discharge  09/08/2018 11:23 AM [name]  MRN:  [id number] Principal Problem: Dementia with behavioral disturbance [facility]) Discharge Diagnoses: Principal Problem:   Dementia with behavioral disturbance ([place])  Subjective:  Patient admitted to the ED with edema in his lower legs and agitation, history of dementia.  His edema resolved and medications were adjusted for agitation.  Calm and cooperative since Friday night.  Geriatric psychiatric inpatient units denied him due to his medical acuity.  No suicidal/homicidal ideations, hallucinations, or substance abuse.  His agitation is erractic but appears to be worse in the evening and better in the morning.  Total Time spent with patient: 30 minutes  Past Psychiatric History: dementia  Past Medical History:  Past Medical History:  Diagnosis Date  . Anemia   . Hernia   . Hyperlipemia   . Hypertension   . Memory loss   . Obstructive nephropathy   . Prostate CA ([place])   . 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 BACK SURGERY  1986   Lumbar Laminectomy  . CATARACT EXTRACTION Bilateral R eye 03/2013, L eye09/2014   . colon polyps  Nov. 13, 2006  . EYE SURGERY Bilateral    Cataract  . HERNIA REPAIR  1191,  4782   Umbilical and Left inguinal   . JOINT REPLACEMENT    . PROSTATE SURGERY    . PROSTATECTOMY  2004   Radical   . shingles in right eye    . SKIN CANCER EXCISION  Nov. 10, 2011   Left scalp and Left forearm- SCC  . SPINE SURGERY  1986   Lumbar laminectomy  . THYROID LOBECTOMY Bilateral    Family History:  Family History  Problem Relation Age of Onset  . Kidney disease Mother        ESRD  . Cancer Mother   . Heart disease Father   . Stroke Father    Family Psychiatric  History: none Social History:  Social History   Substance and Sexual Activity   Alcohol Use Yes  . Alcohol/week: 7.0 - 10.0 standard drinks  . Types: 7 - 10 Glasses of wine per week   Comment: everyday     Social History   Substance and Sexual Activity  Drug Use No    Social History   Socioeconomic History  . Marital status: Married    Spouse name: Not on file  . Number of children: Not on file  . Years of education: Not on file  . Highest education level: Not on file  Occupational History  . Not on file  Social Needs  . Financial resource strain: Not on file  . Food insecurity:    Worry: Not on file    Inability: Not on file  . Transportation needs:    Medical: Not on file    Non-medical: Not on file  Tobacco Use  . Smoking status: Former Smoker    Last attempt to quit: 08/14/1984    Years since quitting: 34.0  . Smokeless tobacco: Never Used  Substance and Sexual Activity  . Alcohol use: Yes    Alcohol/week: 7.0 - 10.0 standard drinks    Types: 7 - 10 Glasses of wine per week    Comment: everyday  . Drug use: No  . Sexual activity: Never  Lifestyle  . Physical activity:    Days per week: Not on file    Minutes per session: Not on file  . Stress: Not on file  Relationships  . Social connections:    Talks on phone: Not on file    Gets together: Not on file    Attends religious service: Not on file    Active member of club or organization: Not on file    Attends meetings of clubs or organizations: Not on file    Relationship status: Not on file  Other Topics Concern  . Not on file  Social History Narrative  . Not on file    Has this patient used any form of tobacco in the last 30 days? (Cigarettes, Smokeless Tobacco, Cigars, and/or Pipes) NA  Current Medications: Current Facility-Administered Medications  Medication Dose Route Frequency Provider Last Rate Last Dose  . aspirin EC tablet 81 mg  81 mg Oral Daily Lacretia Leigh, MD   81 mg at 09/08/18 1045  . levothyroxine (SYNTHROID, LEVOTHROID) tablet 137 mcg  137 mcg Oral Q0600  Lacretia Leigh, MD   137 mcg at 09/08/18 0601  . metoprolol tartrate (LOPRESSOR) tablet 25 mg  25 mg Oral Daily Lacretia Leigh, MD   25 mg at 09/08/18 1045  . OLANZapine (ZYPREXA) tablet 2.5 mg  2.5 mg Oral BID PRN Ethelene Hal, NP   2.5 mg at 09/08/18 0556  . OLANZapine zydis (ZYPREXA) disintegrating tablet 5 mg  5 mg Oral QHS Ethelene Hal, NP   5 mg at 09/06/18 2238   Current Outpatient Medications  Medication Sig Dispense Refill  . acetaminophen (TYLENOL) 500 MG tablet Take 1,000 mg by mouth 2 (two) times daily as needed (pain).     Marland Kitchen ALPRAZolam (XANAX) 0.25 MG tablet Take 0.25 mg by mouth daily as needed for anxiety (agitation). Once a day prn agitation    . aspirin 81 MG tablet Take 81 mg by mouth daily.      Marland Kitchen levothyroxine (SYNTHROID, LEVOTHROID) 137 MCG tablet     . Melatonin 3 MG TABS Take 3 mg by mouth. 3mg  q hs    . metoprolol tartrate (LOPRESSOR) 25 MG tablet Take 25 mg by mouth daily.     . miconazole (BAZA ANTIFUNGAL) 2 % cream Apply 1 application topically 3 (three) times daily. Apply on buttocks/sacral area    . OLANZapine (ZYPREXA) 2.5 MG tablet Take 2.5 mg by mouth at bedtime.    . polyethylene glycol (MIRALAX / GLYCOLAX) packet Take 17 g by mouth daily as needed. And as needed for constipation     . sertraline (ZOLOFT) 50 MG tablet Take 50 mg by mouth daily.      PTA Medications: (Not in a hospital admission)   Musculoskeletal: Strength & Muscle Tone: within normal limits Gait & Station: normal Patient leans: N/A  Psychiatric Specialty Exam: Physical Exam  Nursing note and vitals reviewed. Constitutional: He appears well-developed and well-nourished.  HENT:  Head: Normocephalic.  Neck: Normal range of motion.  Respiratory: Effort normal.  Musculoskeletal: Normal range of motion.  Neurological: He is alert.  Psychiatric: His speech is normal and behavior is normal. Thought content normal. His affect is blunt. Cognition and memory are impaired.  He expresses impulsivity.    Review of Systems  Psychiatric/Behavioral: Positive for memory loss.  All other systems reviewed and are negative.   Blood pressure (!) 141/90, pulse 75, temperature 97.6 F (36.4 C), temperature source Axillary, resp. rate 20, SpO2 97 %.  There is no height or weight on file to calculate BMI.  General Appearance: Casual  Eye Contact:  Good  Speech:  Slow  Volume:  Decreased  Mood:  Euthymic  Affect:  Blunt  Thought Process:  Coherent  Orientation:  Other:  person  Thought Content:  WDL  Suicidal Thoughts:  No  Homicidal Thoughts:  No  Memory:  Immediate;   Poor Recent;   Poor Remote;   Poor  Judgement:  Impaired  Insight:  Lacking  Psychomotor Activity:  Normal  Concentration:  Concentration: Fair and Attention Span: Poor  Recall:  Poor  Fund of Knowledge:  Fair  Language:  Poor  Akathisia:  No  Handed:  Right  AIMS (if indicated):     Assets:  Housing Leisure Time Resilience Social Support  ADL's:  Intact  Cognition:  Impaired,  Moderate  Sleep:        Demographic Factors:  Male, Age 81 or older and Caucasian  Loss Factors: NA  Historical Factors: Impulsivity  Risk Reduction Factors:   Sense of responsibility to family and Positive social support  Continued Clinical Symptoms:  NOne  Cognitive Features That Contribute To Risk:  None    Suicide Risk:  Minimal: No identifiable suicidal ideation.  Patients presenting with no risk factors but with morbid ruminations; may be classified as minimal risk based on the severity of the depressive symptoms    Plan Of Care/Follow-up recommendations:  Dementia with behavioral disturbances: -Continued Zyprexa 5 mg at bedtime and 2.5 mg BID PRN agitation Activity:  as tolerated Diet:  heart healthy diet  Disposition: discharge to his SNF Waylan Boga, NP 09/08/2018, 11:23 AM  Patient seen face-to-face for psychiatric evaluation, chart reviewed and case discussed with the physician  extender and developed treatment plan. Reviewed the information documented and agree with the treatment plan. Corena Pilgrim, MD
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
## 2018-09-08 NOTE — ED Notes (Signed)
Pt continues to sleep, will give morning meds when he wakes.

## 2018-09-08 NOTE — Progress Notes (Signed)
CSW spoke with [facility] representative regarding patient returning to facility. CSW was informed admission director, Carloyn Jaeger, was not available on weekends and would be available on Monday.   CSW will update week day CSW.   Kingsley Spittle, LCSW Clinical Social Worker  [facility] [employer] [facility]  [phone]

## 2018-09-08 NOTE — ED Notes (Signed)
Dr's have made rounds,plan is to discharge pt back to facility. They spoke with wife and answered her questions.

## 2018-09-09 NOTE — Progress Notes (Addendum)
CSW received handoff from Weekend CSW. CSW aware patient has been psychiatrically and medically cleared for discharge. CSW spoke with [facility], Admissions Director of the [place] side of [facility], regarding patient disposition. Charity stated they would have to assess patient and informed CSW she would be reaching out to her Development worker, international aid regarding next steps. Per [facility], she would call CSW back when she has more information. CSW to await return call.  12:10pm- CSW received voicemail from [facility] requesting [facility] fax patient's discharge paperwork and updated FL2. CSW informed by RN that patient has not been up and ambulatory since Thursday when patient arrived. CSW has requested that nursing staff get patient up and walking prior to CSW filling out FL2. Per RN, she will complete this once patient has eaten lunch. CSW will continue to follow.  2:00pm- CSW has faxed necessary documentation to [facility]. CSW attempted to follow up with [facility] regarding time to send patient. CSW left voicemail for return call.  2:58pm- CSW informed patient had difficulty ambulating when nursing staff got patient up and walking. CSW informed that RN has requested PT consult. CSW to leave handoff for 2nd shift CSW.   Ollen Barges, [place] Work Department  [employer]  [phone]

## 2018-09-09 NOTE — ED Notes (Signed)
Telephoned SW [place] re patient not being able to have PT eval this evening. He is aware and planning for him to be discharged some time tomorrow after PT eval can be completed in am.

## 2018-09-09 NOTE — ED Notes (Signed)
PT order pending but there is no one in the PT dept in the evening to seem him. This will not happen until the am.

## 2018-09-09 NOTE — NC FL2 (Addendum)
[place] LEVEL OF CARE SCREENING TOOL     IDENTIFICATION  Patient Name: [name] Birthdate: 1935/10/04 Sex: male Admission Date (Current Location): 09/05/2018  [facility] and [place] Number:  Herbalist and Address:  [facility],  [place] [address], [place]      Provider Number: [id number]  Attending Physician Name and Address:  Default, Provider, MD  Relative Name and Phone Number:  Darl Pikes [phone]    Current Level of Care: Hospital Recommended Level of Care: [place] Prior Approval Number:    Date Approved/Denied:   PASRR Number:    Discharge Plan: Other (Comment)([facility])    Current Diagnoses: Patient Active Problem List   Diagnosis Date Noted  . 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 Mixed incontinence 06/05/2018  . Dementia with behavioral disturbance ([place]) 01/13/2015  . Macrocytosis 10/30/2013  . Peripheral vascular disease, unspecified ([place]) 08/19/2013  . Pseudophakia of both eyes 04/02/2013  . Myopia 09/11/2012  . Malignant neoplasm of prostate ([place]) 04/17/2012  . Corneal scar 01/17/2012  . Osteopenia 09/07/2011  . Central opacity of cornea 08/16/2011  . Anemia 05/15/2011  . Cardiac arrhythmia 05/15/2011  . Dyslipidemia 05/15/2011  . Renal insufficiency syndrome 05/15/2011  . PERSONAL HISTORY OF COLONIC POLYPS 09/13/2010  . HYPERCHOLESTEROLEMIA 09/22/2009  . HYPERTENSION 09/22/2009  . PALPITATIONS 09/03/2009    Orientation RESPIRATION BLADDER Height & Weight     Self  Normal Incontinent Weight:   Height:     BEHAVIORAL SYMPTOMS/MOOD NEUROLOGICAL BOWEL NUTRITION STATUS      Incontinent Diet(Regular)  AMBULATORY STATUS COMMUNICATION OF NEEDS Skin   Limited assistance Verbally                         Personal Care Assistance Level of Assistance  Bathing, Feeding, Dressing Bathing Assistance: Limited assistance Feeding assistance: Limited  Assistance Dressing Assistance: Limited assistance     Functional Limitations Info             SPECIAL CARE FACTORS FREQUENCY                       Contractures      Additional Factors Info  Code Status Code Status Info: Full             Current Medications (09/09/2018):  This is the current hospital active medication list Current Facility-Administered Medications  Medication Dose Route Frequency Provider Last Rate Last Dose  . aspirin EC tablet 81 mg  81 mg Oral Daily Lacretia Leigh, MD   81 mg at 09/08/18 1045  . levothyroxine (SYNTHROID, LEVOTHROID) tablet 137 mcg  137 mcg Oral Q0600 Lacretia Leigh, MD   137 mcg at 09/09/18 [phone]  . metoprolol tartrate (LOPRESSOR) tablet 25 mg  25 mg Oral Daily Lacretia Leigh, MD   25 mg at 09/08/18 1045  . OLANZapine (ZYPREXA) tablet 2.5 mg  2.5 mg Oral BID PRN Ethelene Hal, NP   2.5 mg at 09/08/18 0556  . OLANZapine zydis (ZYPREXA) disintegrating tablet 5 mg  5 mg Oral QHS Ethelene Hal, NP   5 mg at 09/08/18 2227   Current Outpatient Medications  Medication Sig Dispense Refill  . acetaminophen (TYLENOL) 500 MG tablet Take 1,000 mg by mouth 2 (two) times daily as needed (pain).     Marland Kitchen aspirin 81 MG tablet Take 81 mg by mouth daily.      Marland Kitchen levothyroxine (SYNTHROID, LEVOTHROID) 137  MCG tablet     . Melatonin 3 MG TABS Take 3 mg by mouth. 3mg  q hs    . metoprolol tartrate (LOPRESSOR) 25 MG tablet Take 25 mg by mouth daily.     . miconazole (BAZA ANTIFUNGAL) 2 % cream Apply 1 application topically 3 (three) times daily. Apply on buttocks/sacral area    . polyethylene glycol (MIRALAX / GLYCOLAX) packet Take 17 g by mouth daily as needed. And as needed for constipation     . sertraline (ZOLOFT) 50 MG tablet Take 50 mg by mouth daily.     Marland Kitchen OLANZapine (ZYPREXA) 2.5 MG tablet Take 1 tablet (2.5 mg total) by mouth 2 (two) times daily as needed (agitation and aggression). 60 tablet 0  . OLANZapine zydis (ZYPREXA) 5 MG  disintegrating tablet Take 1 tablet (5 mg total) by mouth at bedtime. 30 tablet 0     Discharge Medications: Please see discharge summary for a list of discharge medications.  Relevant Imaging Results:  Relevant Lab Results:   Additional Information SSN: [id number]  Ollen Barges, LCSWA

## 2018-09-09 NOTE — Progress Notes (Signed)
2nd shift ED CSW received a handoff from the 1st shift [facility] ED CSW.   1st shift ED CSW stated that [facility] where pt resides has been informed pt is to return today and a FL-2 (updated) and D/C paperwork was sent to them earlier at D/C.  CSW was provided with a copy of the "rescinding the IVC" form that is available with Disposition should pt be D/C'd.  CSW was informed by 1st shift ED CWS that at D/C the RN observed the pt had increased difficulty with ambulation and requested a PT consult.  While awaiting results of PT consult CSW received a call from the Director at [facility] where pt resides and was told the pt's medication would now not be available to the pt until [place] and that a private caregiver who will provide needed assistance for the pt will not be available to assist the pt until 1/28.  The [facility] director then asked if pt can remain at the [facility] ED until Tuesday 1/28.  CSW stated that if pt was seen by PT and cleared then pt could not remain overnight and would have to D/C on 1/28 and if that were the case the CSW would call the [facility] back at ph: [phone].   CSW awaiting results of PT consult.  CSW will continue to follow for D/C needs.  Alphonse Guild. Mykell Rawl, LCSW, LCAS, CSI Clinical Social Worker Ph: [phone]

## 2018-09-09 NOTE — ED Notes (Signed)
INCREASED AGITATION WITH CONTINUOUS CONTACT DURING CARE. IT IS ENCOURAGED TO KEEP A FOCUSED CARE PLAN WHEN APPROACHING THIS PT. PT IS REDIRECTABLE. PT WILL CALM ONCE STIMULATION HAS BEEN REMOVED. PT CURRENTLY CALM AND FAMILY IS PRESENT. FAMILY AWARE OF PLAN OF CARE.
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
## 2018-09-09 NOTE — ED Notes (Signed)
MCKENZIE-SOCIAL WORKER- SPEAKING WITH WIFE "KATE" WITH PLAN OF CARE.

## 2018-09-09 NOTE — ED Notes (Signed)
OOB in chair for 5 hours; staff pivot transfer.

## 2018-09-10 ENCOUNTER — Emergency Department (HOSPITAL_COMMUNITY): Payer: Medicare Other

## 2018-09-10 MED ORDER — FUROSEMIDE 20 MG PO TABS
20.0000 mg | ORAL_TABLET | Freq: Once | ORAL | Status: AC
  Administered 2018-09-10: 20 mg via ORAL
  Filled 2018-09-10: qty 1

## 2018-09-10 MED ORDER — FUROSEMIDE 20 MG PO TABS: 20.0000 mg | ORAL_TABLET | Freq: Every day | ORAL | Status: DC

## 2018-09-10 MED ORDER — HYDROCODONE-ACETAMINOPHEN 5-325 MG PO TABS
1.0000 | ORAL_TABLET | ORAL | Status: DC | PRN
  Administered 2018-09-10: 1 via ORAL
  Filled 2018-09-10: qty 1
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
## 2018-09-10 NOTE — ED Notes (Signed)
Report called to [facility] called.

## 2018-09-10 NOTE — BH Assessment (Signed)
[place] Assessment Progress Note  At 09:07 this writer called [facility] and spoke to [facility].  I informed him that this pt no longer needs admission to their facility.  Jalene Mullet, [place] Coordinator [phone]
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
## 2018-09-10 NOTE — ED Provider Notes (Signed)
Pt has been here for several days awaiting placement.  Pt's nurse called me over to assess pt's breathing.  Breathing sounded loud.  O2 sats 98% on RA.  CXR shows  IMPRESSION: Cardiomegaly with aortic atherosclerosis and mild central vascular Congestion.  I will add lasix for the next few days.   Isla Pence, MD 09/10/18 [phone]

## 2018-09-10 NOTE — Progress Notes (Addendum)
CSW still following for discharge needs. Patient was initially brought here by EMS from [facility] due to patient's agitation at facility. Patient was being recommended for geropsych placement but was psych cleared as of Sunday. CSW informed yesterday that patient was having difficulty ambulating and EDP put in PT consult. CSW spoke with PT who confirmed patient was needing extra assistance but stated patient would have difficulty following a rehab program due to dementia. Per handoff left by 2nd shift CSW, [facility] was coordinating a private caregiver to assist with patient's needs but caregiver would not be available until today 1/28. CSW has attempted to follow up with [facility] regarding updated information. CSW has left voicemail for return call.  11:38am- CSW has attempted to reach out to [facility] a second time regarding disposition. CSW has left voicemail for return call.  2:26pm- CSW received phone call from patient's wife, [name], who was with the Chief Financial Officer of Nursing of [facility]. CSW explained that CSW has made numerous attempts to get in contact with [facility] but has not received return call. Administration requesting documents that were sent yesterday. CSW has refaxed these. CSW awaits return call from facility.  Ollen Barges, [place] Work Department  [employer]  [phone]

## 2018-09-10 NOTE — ED Notes (Signed)
Called Dr to have her take a look at him re his noisy breathing. His O2 sat using here probe is 98% She ordered a chest xray and it is being done now. Head is elevated and repositioned before chest xray.

## 2018-09-10 NOTE — Progress Notes (Signed)
Call received from [facility] requesting to know if pt had received his nighttime med. Information given according to nurse's request. VWilliams,RN.

## 2018-09-10 NOTE — ED Notes (Signed)
No noticeable change since getting pain medication. Repositioned him again since getting

## 2018-09-10 NOTE — ED Notes (Signed)
Patient able to take medications mixed in applesauce.

## 2018-09-10 NOTE — Progress Notes (Signed)
Patient set to return to [facility] at this time. CSW notified RN and MD- both agreeable.   Please call report to [place], [place]  [phone]

## 2018-09-10 NOTE — ED Notes (Signed)
Patient ate well with sitter assisting.  Patient is one to one needs complete assistance with ADL's.
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
## 2018-09-10 NOTE — ED Notes (Signed)
Difficult to arouse for his Lasix. Crushed it in pudding for him to swallow easier. Sternal rub multiple times to get him awake enough to take it. Continues to have noisy breathing but his O2 rate has been consistent at 98.
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
## 2018-09-10 NOTE — ED Notes (Signed)
Pt is alert with eye opening response. Pt is calm and cooperative. Pt reposition and offered food.

## 2018-09-10 NOTE — Evaluation (Signed)
Physical Therapy Evaluation Patient Details Name: [name] MRN: [id number] DOB: 1936/05/04 Today's Date: 09/10/2018   History of Present Illness  83 Y/O male from [facility] with LE swellling and combativeness on 09/05/18.  Clinical Impression  The patient required hand over hand, multimodal cues to mobilize, ambulated x 5'. Patient was not combative, does not follow commands. Continue trial of PT for mobility at this time. For improving functional mobility to decrease caregiver burden.    Follow Up Recommendations (requires assistance, unsure if patient can participate in Skilled therapy at this time.)    Equipment Recommendations  None recommended by PT    Recommendations for Other Services       Precautions / Restrictions Precautions Precautions: Fall Precaution Comments: combative      Mobility  Bed Mobility Overal bed mobility: Needs Assistance Bed Mobility: Supine to Sit;Sit to Supine     Supine to sit: Max assist;+2 for physical assistance;+2 for safety/equipment;HOB elevated Sit to supine: Max assist;+2 for physical assistance;+2 for safety/equipment   General bed mobility comments: tactile and multimodal cues  to mobilize to sitting at bed edge then assisted back to bed very carefully.  Transfers Overall transfer level: Needs assistance Equipment used: Rolling walker (2 wheeled) Transfers: Sit to/from Stand Sit to Stand: Max assist;+2 physical assistance;+2 safety/equipment Stand pivot transfers: Max assist;+2 physical assistance;+2 safety/equipment       General transfer comment: multimodal cues to grasp RW, hand over hand. Arms jerking. Assisted to stand pivot from recliner to bed. Manual assist to sit down to recliner and bed.  Ambulation/Gait Ambulation/Gait assistance: Max assist;+2 physical assistance;+2 safety/equipment Gait Distance (Feet): 5 Feet Assistive device: Rolling walker (2 wheeled) Gait Pattern/deviations: Shuffle;Step-to  pattern;Staggering right;Staggering left     General Gait Details: shuffle steps forward, manual cues to  keep holding onto RW. Recliner brought up.  Stairs            Wheelchair Mobility    Modified Rankin (Stroke Patients Only)       Balance Overall balance assessment: Needs assistance Sitting-balance support: Feet supported;No upper extremity supported Sitting balance-Leahy Scale: Fair     Standing balance support: During functional activity;Bilateral upper extremity supported Standing balance-Leahy Scale: Poor                               Pertinent Vitals/Pain Pain Assessment: Faces Faces Pain Scale: No hurt    Home Living Family/patient expects to be discharged to:: Skilled nursing facility Living Arrangements: Other (Comment)(memory care at [facility])                    Prior Function Level of Independence: Needs assistance   Gait / Transfers Assistance Needed: per report, patient ambualtory at facility.           Hand Dominance        Extremity/Trunk Assessment   Upper Extremity Assessment Upper Extremity Assessment: RUE deficits/detail;LUE deficits/detail RUE Deficits / Details: jerking movements LUE Deficits / Details: same as right    Lower Extremity Assessment Lower Extremity Assessment: RLE deficits/detail;LLE deficits/detail RLE Deficits / Details: did bear weight LLE Deficits / Details: did bear weight    Cervical / Trunk Assessment Cervical / Trunk Assessment: Kyphotic  Communication   Communication: Expressive difficulties;Receptive difficulties  Cognition Arousal/Alertness: Awake/alert Behavior During Therapy: Restless Overall Cognitive Status: No family/caregiver present to determine baseline cognitive functioning  General Comments: does not follow verbal cues, was not agitated with tactile cues to sit up and  stand.       General Comments       Exercises     Assessment/Plan    PT Assessment Patient needs continued PT services  PT Problem List Decreased range of motion;Decreased cognition;Decreased activity tolerance;Decreased knowledge of use of DME;Decreased balance;Decreased mobility;Decreased knowledge of precautions       PT Treatment Interventions DME instruction;Gait training;Functional mobility training;Therapeutic activities;Patient/family education;Cognitive remediation    PT Goals (Current goals can be found in the Care Plan section)  Acute Rehab PT Goals PT Goal Formulation: Patient unable to participate in goal setting Time For Goal Achievement: 09/24/18 Potential to Achieve Goals: Poor(guarded due to MS)    Frequency Min 2X/week   Barriers to discharge        Co-evaluation               AM-PAC PT "6 Clicks" Mobility  Outcome Measure Help needed turning from your back to your side while in a flat bed without using bedrails?: Total Help needed moving from lying on your back to sitting on the side of a flat bed without using bedrails?: Total Help needed moving to and from a bed to a chair (including a wheelchair)?: Total Help needed standing up from a chair using your arms (e.g., wheelchair or bedside chair)?: Total Help needed to walk in hospital room?: Total Help needed climbing 3-5 steps with a railing? : Total 6 Click Score: 6    End of Session Equipment Utilized During Treatment: Gait belt Activity Tolerance: Patient tolerated treatment well Patient left: in bed;with call bell/phone within reach;with nursing/sitter in room Nurse Communication: Mobility status PT Visit Diagnosis: Unsteadiness on feet (R26.81)    Time: 1916-6060 PT Time Calculation (min) (ACUTE ONLY): 13 min   Charges:   PT Evaluation $PT Eval Low Complexity: [place] Pager [phone] Office [phone]   Claretha Cooper 09/10/2018, 9:49 AM

## 2018-09-12 ENCOUNTER — Encounter: Payer: Self-pay | Admitting: Internal Medicine

## 2018-09-12 ENCOUNTER — Non-Acute Institutional Stay: Payer: Medicare Other | Admitting: Internal Medicine

## 2018-09-12 VITALS — BP 108/64 | HR 80 | Resp 16

## 2018-09-12 DIAGNOSIS — Z515 Encounter for palliative care: Secondary | ICD-10-CM

## 2018-09-12 NOTE — Progress Notes (Addendum)
Jan 30th, 2020 [facility] Palliative Care Telephone: [phone] Fax: [phone]  PATIENT NAME:[name] DOB:[birth year] AYT:[id number] [place] PROVIDER:South, Annie Main, MD  REFERRING PROVIDER:Lindsay Truman Hayward NP  RESPONSIBLE PARTY:[name] 336 [phone]  ASSESSMENT: 1. Progressive Severe Dementia with behavioral disturbances: My last visit was 10 days earlier. I had discontinued patient's Depakote 2/2 signs of increased lethargy and decreased ability to follow commands. I  started Zyprexa at 2.5 mg qhs. On 09/05/2018 patient was transferred to [facility] for combative behavior (striking out at  facility staff). He spent 5 days in [facility] ER, as there were no local inpatient psychiatric beds available. He was discharged back to [place] on an increased dose Zyprexa 5 mg qhs, with 2.5mg  bid prn breakthrough agitation. He was continued on Zoloft 50mg  qd for mood stabilization. When patient arrived back to the facility from the ER, he struck out at staff. Wife [name] believes behavior was precipitated by rough handling at time of transfer, at which time patient fell to the floor. The facility director stipulated that [name] needed to provide 24/7 sitter coverage, which [name] has put in place. Today, patient has been restless; fidgeting with his blankets. He is constantly confused. His speech is nonsensical; he is unable to hold a train of thought. He is unable to follow cues to sit. He is incontinent of bowel and bladder. He is able to feed himself. Staff report he ate 75% of lunch, though wife reports just bites and sips most of this last week and notes he looks thinner. Staff are unable to obtain a weight.  -Long discussion with PCG [name]. She is agreeable to increase Zyprexa to 10 mg po qhs to hopefully decrease patient's combative behavior. Will start 7.5mg  this evening (as he has already received 5mg  total for today), and then 10mg  qd thereafter.   -ADDENEDUM: F/U TC form [name] who reports that patient's neurologist from [facility] has increased his Zoloft to 100 mg po qd.  2. Supportive counseling caregiver stress: [name] and I reviewed progressive signs of patient's cognitive and functional decline, and the expected continuation of decline as is the nature of dementia. Johnny vented regarding the stress, difficulty, and feelings of being overwhelmed with her husband's care and roller coaster course of his symptoms.   3. Goals of Care: Maintain patient in facility with medical and behavioral management of combative behavior. If continues trajectory of his dementia, may soon meet Hospice criteria. I discussed this with [name].  4. F/U: NP PC visit within the next 1-2 weeks. [name] has my contact information and will call me to see patient prn sooner if needed.   Advanced Care Directives:DNR and MOST form on chart. Details: DNR/DNI, limited scope of medical intervention, use of Antibiotics and IVFs to be determined at time of need, no tube feeding.  I spent65minutes providing this consultation, from4 pm to 5pm. More than 50% of the time in this consultation was spent coordinating communication, interview of staff, and conversation with PCG.   HISTORY OF PRESENT ILLNESS / INTERVAL HISTORY:Johnny W Williamsonis a 83 y.o.malewith medical h/o advanced dementia, anemia, HLD, HTN, obstructive nephropathy, renal insufficiency, PVD, and prostate (prostatectomy/XRT; PSA < <0.12 Dec 2017), thyroid, and skin (lip) cancer.He has mixed urinary incontinence (benign cystoscopy).He is s/p ER visit 11/519 for RUQ abdominal pain (no etiologyelicited; neg CT and ultrasound).This is a Palliative Care f/u visit from 08/23/2018.  PPS:40%  HOSPICE ELIGIBILITY/DIAGNOSIS: TBD  PAST MEDICAL HISTORY:  Past Medical History:  Diagnosis Date  .  Anemia   . Hernia   . Hyperlipemia   . Hypertension   . Memory loss   . Obstructive nephropathy   . Prostate  CA ([place])   . 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 Smoking status: Former Smoker    Last attempt to quit: 08/14/1984    Years since quitting: 34.1  . Smokeless tobacco: Never Used  Substance Use Topics  . Alcohol use: Yes    Alcohol/week: 7.0 - 10.0 standard drinks    Types: 7 - 10 Glasses of wine per week    Comment: everyday    ALLERGIES: No Known Allergies    PERTINENT MEDICATIONS:  Outpatient Encounter Medications as of 09/12/2018  Medication Sig  . aspirin 81 MG tablet Take 81 mg by mouth daily.    Marland Kitchen levothyroxine (SYNTHROID, LEVOTHROID) 137 MCG tablet   . Melatonin 3 MG TABS Take 3 mg by mouth. 3mg  q hs  . metoprolol tartrate (LOPRESSOR) 25 MG tablet Take 25 mg by mouth daily.   . miconazole (BAZA ANTIFUNGAL) 2 % cream Apply 1 application topically 3 (three) times daily. Apply on buttocks/sacral area  . OLANZapine (ZYPREXA) 10 MG tablet Take 10 mg by mouth at bedtime.  . polyethylene glycol (MIRALAX / GLYCOLAX) packet Take 17 g by mouth daily as needed. And as needed for constipation   . sertraline (ZOLOFT) 100 MG tablet Take 100 mg by mouth daily.  . [DISCONTINUED] acetaminophen (TYLENOL) 500 MG tablet Take 1,000 mg by mouth 2 (two) times daily as needed (pain).   . [DISCONTINUED] OLANZapine (ZYPREXA) 2.5 MG tablet Take 1 tablet (2.5 mg total) by mouth 2 (two) times daily as needed (agitation and aggression). (Patient taking differently: Take 10 mg by mouth at bedtime. )  . [DISCONTINUED] OLANZapine zydis (ZYPREXA) 5 MG disintegrating tablet Take 1 tablet (5 mg total) by mouth at bedtime.  . [DISCONTINUED] sertraline (ZOLOFT) 50 MG tablet Take 50 mg by mouth daily.    No facility-administered encounter medications on file as of 09/12/2018.     PHYSICAL EXAM:  BP 108/64, HR 20 RR 16 Smiling and pleasant but very figety. Reclined  back in recliner. Very short attention span, and inability to follow cues or directions.  Cardiovascular: regular rate and rhythm; occasional early beats Pulmonary: clear ant fields Abdomen: soft, nontender, + bowel sounds GU: no suprapubic tenderness Extremities: no edema, no joint deformities Skin: no rashes Neurological: Generalized weakness   Julianne Handler, NP

## 2018-09-16 ENCOUNTER — Telehealth: Payer: Self-pay | Admitting: Internal Medicine

## 2018-09-16 NOTE — Telephone Encounter (Signed)
7:15pm:  Text Message from [place] wife Ulis Rias, in answer to my query on how her husband was doing on increased Zyprexa and Increased Zoloft dosing. Ulis Rias replied   "Quillan does not seem as aggressive the last day or so. He babbles at times but then will say things that I understand. He did not have a bowel movement for days but able to give him an enema and able to go yesterday and today. Would you write an order for MiraLax every other day and hold if having diarrhea until regular stools? Also he has an order for cream for his bottom 3x/d. Could you change that to as needed?"  I told Ulis Rias I would take care of this tomorrow, and meet her at the facility tomorrow as well, as she requested in a follow up text.   Violeta Gelinas NP-C [phone]

## 2018-09-17 ENCOUNTER — Non-Acute Institutional Stay: Payer: Medicare Other | Admitting: Internal Medicine

## 2018-09-17 ENCOUNTER — Encounter: Payer: Self-pay | Admitting: Internal Medicine

## 2018-09-17 DIAGNOSIS — Z515 Encounter for palliative care: Secondary | ICD-10-CM

## 2018-09-17 NOTE — Progress Notes (Signed)
Feb 4th, 2020 [facility] Palliative Care Telephone: [phone] Fax: [phone]  PATIENT NAME:[name] DOB:07-Jun-1936 TKZ:[id number] [place] PROVIDER:South, Annie Main, MD  [place] NP  [place] 336 [phone]  1. Progressive Severe Dementia with behavioral disturbances:FAST 7c. Improvement in aggressive behaviors with increase of Zyprexa to 10 mg qhs, and increase of Zoloft to 100 mg qd. Episodically will grab out at caregivers if he becomes alarmed (such as if he gets off balance with transfers) but no longer swinging out at staff or family. Though can still transfer independently, his gait is increasingly unsteady. Needs 2 persons assist. Patient is unable to cognate well enough to use a walker. It is difficult to cue him to sit.  Decline in cognition; can only briefly engage eye contact. Nonsensical speech and babbling; most often in good spirits. Only rarely now stringing a phrase together. Sometimes up most of the night singing/talking to himself. I reassured family this was not unusual behavior in his disease process. He continues incontinent of bladder and sometimes bowel. Does better with a toileting schedule but it is difficult to cue him to sit. Now needs to be consistently fed, though can manage a few finger foods. Good oral intake. Improvement in LE edema and abdominal swelling.   2. Supportive counseling caregiver stress: Ulis Rias and I again reviewed progressive signs of patient's cognitive and functional decline, and the expected continuation of decline as is the nature of dementia. Ulis Rias is seeing a Social worker. Kaye's sister was here during our visit and seems a great source of support.  3. Goals of Care: Maintain patient in facility with medical and behavioral management of combative behavior. If continues trajectory of his dementia, may soon meet Hospice criteria. I again discussed this  with Ulis Rias. We went into some detail regarding eligibility criteria and specific services provided. Ulis Rias affirmed her wish for comfort measures only, and to avoid ER/hospitalizations.   4. F/U: NP PC visit within the next 3-4 weeks. Ulis Rias has my contact information and will call me to see patient prn sooner if needed. Facility coordinator Carloyn Jaeger present during visit and updated as to plan of care. Charity has my contact information.  Advanced Care Directives:DNR and MOST form on chart. Details: DNR/DNI, limited scope of medical intervention, use of Antibiotics and IVFs to be determined at time of need, no tube feeding.  I spent48minutes providing this consultation, from4pm to5pm. More than 50% of the time in this consultation was spent coordinating communication, interview of staff, and conversation with PCG.   HISTORY OF PRESENT ILLNESS / INTERVAL HISTORY:Johnny W Williamsonis a 83 y.o.malewith medical h/o advanced dementia, anemia, HLD, HTN, obstructive nephropathy, renal insufficiency, PVD, and prostate (prostatectomy/XRT; PSA <<0.12 Dec 2017), thyroid, and skin (lip) cancer.He has mixed urinary incontinence (benign cystoscopy).He is s/p ER visit 11/519 for RUQ abdominal pain (no etiologyelicited; neg CT and ultrasound).This is aPalliative Caref/u visit from 09/12/2018.  PPS:40%  HOSPICE ELIGIBILITY/DIAGNOSIS: TBD  PAST MEDICAL HISTORY:  Past Medical History:  Diagnosis Date  . Anemia   . Hernia   . Hyperlipemia   . Hypertension   . Memory loss   . Obstructive nephropathy   . Prostate CA ([place])   . 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 Thyroid ca ([place]) 5427   Follicular Variant of Papillary CA     SOCIAL HX:  Social History   Tobacco Use  . Smoking status: Former  Smoker    Last attempt to quit: 08/14/1984    Years since quitting: 34.1  . Smokeless tobacco: Never Used  Substance Use Topics  . Alcohol use: Yes     Alcohol/week: 7.0 - 10.0 standard drinks    Types: 7 - 10 Glasses of wine per week    Comment: everyday    ALLERGIES: No Known Allergies   PERTINENT MEDICATIONS:  Outpatient Encounter Medications as of 09/17/2018  Medication Sig  . aspirin 81 MG tablet Take 81 mg by mouth daily.    Marland Kitchen levothyroxine (SYNTHROID, LEVOTHROID) 137 MCG tablet   . Melatonin 3 MG TABS Take 3 mg by mouth. 3mg  q hs  . metoprolol tartrate (LOPRESSOR) 25 MG tablet Take 25 mg by mouth daily.   . miconazole (BAZA ANTIFUNGAL) 2 % cream Apply 1 application topically 3 (three) times daily. Apply on buttocks/sacral area  . OLANZapine (ZYPREXA) 10 MG tablet Take 10 mg by mouth at bedtime.  . polyethylene glycol (MIRALAX / GLYCOLAX) packet Take 17 g by mouth daily as needed. And as needed for constipation   . sertraline (ZOLOFT) 100 MG tablet Take 100 mg by mouth daily.   No facility-administered encounter medications on file as of 09/17/2018.     PHYSICAL EXAM:   Well nourished, elderly Caucasian male sitting in recliner. He is alert and pleasant, but babbling to himself. Only transient eye contact. Did not take my offered hand.  Cardiovascular: regular rate and rhythm Pulmonary: clear ant fields Abdomen: soft, nontender, + bowel sounds Extremities: no edema, no joint deformities Skin: no rashes Neurological: grossly nonfocal  Julianne Handler, NP

## 2018-09-24 ENCOUNTER — Telehealth: Payer: Self-pay | Admitting: Internal Medicine

## 2018-09-24 NOTE — Telephone Encounter (Signed)
1:45pm:  F/U TC to PCG spouse [name].  [name] reports decreased aggression/agitation after 2 week course of Zyprexa 10mg  qhs, and increased Zoloft 100mg  qd. She notes that patient can occasionally lash out and it can be "scary".[name] doesn't wish to further adjust antipsychotics/antidepressants at this time.The facility requires 24/7 sitters to continue for now, and [name] is trying to mitigate this additional financial burden by spending the nights at the facility. Zigmund Daniel reports patient is generally in good spirits; babbling and laughing to himself. Needs assist to eat. Continues 1-2 person heavy assist to ambulate. Still resistant to sitting down. We discussed Hospice eligibility criteria, and that patient is not yet eligible as he is maintaining good nutritional intake. [name] has my contact information to call me prn. Violeta Gelinas NP-C

## 2018-09-30 ENCOUNTER — Encounter: Payer: Self-pay | Admitting: Internal Medicine

## 2018-09-30 ENCOUNTER — Non-Acute Institutional Stay: Payer: Medicare Other | Admitting: Internal Medicine

## 2018-09-30 VITALS — BP 120/80 | HR 60 | Resp 12

## 2018-09-30 DIAGNOSIS — Z515 Encounter for palliative care: Secondary | ICD-10-CM
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
## 2018-10-14 ENCOUNTER — Telehealth: Payer: Self-pay | Admitting: Internal Medicine

## 2018-10-14 NOTE — Telephone Encounter (Signed)
8:30am TC from PCG spouse Ulis Rias. Ulis Rias reports that patient has been too weak to get out of bed for 3 days. He is only taking sips of fluid, and is basically non-verbal and totally confused. He is reaching out to grasp unseen items out of the air. Ulis Rias is requesting Hospice referral. I contacted Dr. Baldwin Crown office. They have given a verbal consent for hospice referral, and also for Dr. Forde Dandy to continue acting as the attending.  I will follow up with our team. Violeta Gelinas NP-C [phone]

## 2018-11-13 DEATH — deceased
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
